# Patient Record
Sex: Male | Born: 1960
Health system: Southern US, Community
[De-identification: ages and names within clinical notes are randomized; demographics above are authoritative.]

## PROBLEM LIST (undated history)

## (undated) DIAGNOSIS — N183 Chronic kidney disease, stage 3 unspecified: Secondary | ICD-10-CM

## (undated) DIAGNOSIS — I10 Essential (primary) hypertension: Secondary | ICD-10-CM

---

## 2012-11-07 ENCOUNTER — Emergency Department (HOSPITAL_COMMUNITY)
Admission: EM | Admit: 2012-11-07 | Discharge: 2012-11-07 | Disposition: A | Discharge status: Home / Self Care | Payer: Self-pay | Attending: Emergency Medicine | Admitting: Emergency Medicine

## 2012-11-07 ENCOUNTER — Encounter (HOSPITAL_COMMUNITY): Payer: Self-pay | Admitting: *Deleted

## 2012-11-07 DIAGNOSIS — N342 Other urethritis: Secondary | ICD-10-CM | POA: Insufficient documentation

## 2012-11-07 DIAGNOSIS — F17200 Nicotine dependence, unspecified, uncomplicated: Secondary | ICD-10-CM | POA: Insufficient documentation

## 2012-11-07 DIAGNOSIS — I10 Essential (primary) hypertension: Secondary | ICD-10-CM | POA: Insufficient documentation

## 2012-11-07 DIAGNOSIS — R300 Dysuria: Secondary | ICD-10-CM | POA: Insufficient documentation

## 2012-11-07 LAB — URINE MICROSCOPIC-ADD ON

## 2012-11-07 LAB — URINALYSIS, ROUTINE W REFLEX MICROSCOPIC
Glucose, UA: NEGATIVE mg/dL
Protein, ur: NEGATIVE mg/dL
Specific Gravity, Urine: 1.015 (ref 1.005–1.030)

## 2012-11-07 MED ORDER — CEFTRIAXONE SODIUM 250 MG IJ SOLR
250.0000 mg | Freq: Once | INTRAMUSCULAR | Status: AC
  Administered 2012-11-07: 250 mg via INTRAMUSCULAR
  Filled 2012-11-07: qty 250

## 2012-11-07 MED ORDER — HYDROCHLOROTHIAZIDE 25 MG PO TABS: 12.5000 mg | ORAL_TABLET | Freq: Every day | ORAL | Status: DC

## 2012-11-07 MED ORDER — LISINOPRIL 10 MG PO TABS: 10.0000 mg | ORAL_TABLET | Freq: Every day | ORAL | Status: DC

## 2012-11-07 MED ORDER — METRONIDAZOLE 500 MG PO TABS
2000.0000 mg | ORAL_TABLET | Freq: Once | ORAL | Status: AC
  Administered 2012-11-07: 2000 mg via ORAL
  Filled 2012-11-07: qty 4

## 2012-11-07 MED ORDER — AZITHROMYCIN 250 MG PO TABS
1000.0000 mg | ORAL_TABLET | Freq: Once | ORAL | Status: AC
  Administered 2012-11-07: 1000 mg via ORAL
  Filled 2012-11-07: qty 4

## 2012-11-07 NOTE — ED Notes (Signed)
Pt c/o yellowish discharge from his penis since Friday. No odor

## 2012-11-07 NOTE — ED Notes (Signed)
Pt noticed with penile discharge since Friday, pain with urination per pt, denies any sores or swelling

## 2012-11-07 NOTE — ED Provider Notes (Signed)
History    This chart was scribed for Jason Ochoa, *, by Frederik Pear, ED scribe. The patient was seen in room APA04/APA04 and the patient's care was started at 2201.    CSN: [id number]  Arrival date & time 11/07/12  2139   First MD Initiated Contact with Patient 11/07/12 2201      Chief Complaint  Patient presents with  . Penile Discharge    (Consider location/radiation/quality/duration/timing/severity/associated sxs/prior treatment) The history is provided by the patient and medical records. No language interpreter was used.    Jason Ochoa is a 52 y.o. male who presents to the Emergency Department complaining of  sudden onset, intermittent yellow penile discharge with associated dysuria that began 3 days ago. He reports that he has recently had unprotected intercourse with new partners. He denies groin pain, headache, fever, nausea, emesis, diarrhea, or testicular swelling. In ED, his BP is 218/129, but denies a h/o of hypertension and reports that he has not been to a healthcare provider in years.  No PCP.  History reviewed. No pertinent past medical history.  History reviewed. No pertinent past surgical history.  History reviewed. No pertinent family history.  History  Substance Use Topics  . Smoking status: Current Some Day Smoker  . Smokeless tobacco: Not on file  . Alcohol Use: Yes      Review of Systems  Constitutional: Negative for fever.  HENT: Negative for congestion, sore throat and rhinorrhea.   Respiratory: Negative for cough and shortness of breath.   Cardiovascular: Negative for chest pain.  Gastrointestinal: Negative for nausea, vomiting, abdominal pain and diarrhea.  Genitourinary: Positive for discharge.  Musculoskeletal: Negative for back pain.  Neurological: Negative for headaches.    Allergies  Review of patient's allergies indicates no known allergies.  Home Medications  No current outpatient prescriptions on  file.  BP 212/112  Pulse 97  Temp(Src) 97.5 F (36.4 C) (Oral)  Resp 18  Ht 6' (1.829 m)  Wt 235 lb (106.595 kg)  BMI 31.86 kg/m2  SpO2 100%  Physical Exam  Nursing note and vitals reviewed. Constitutional: He is oriented to person, place, and time. He appears well-developed and well-nourished. No distress.  HENT:  Head: Normocephalic and atraumatic.  Right Ear: Hearing normal.  Nose: Nose normal.  Mouth/Throat: Oropharynx is clear and moist and mucous membranes are normal.  Eyes: Conjunctivae and EOM are normal. Pupils are equal, round, and reactive to light.  Neck: Normal range of motion. Neck supple.  Cardiovascular: Normal rate, regular rhythm, S1 normal and S2 normal.  Exam reveals no gallop and no friction rub.   No murmur heard. Hypertensive.  Pulmonary/Chest: Effort normal and breath sounds normal. No respiratory distress. He exhibits no tenderness.  Abdominal: Soft. Normal appearance and bowel sounds are normal. There is no hepatosplenomegaly. There is no tenderness. There is no rebound, no guarding, no tenderness at McBurney's point and negative Murphy's sign. No hernia.  Genitourinary: Testes normal. Uncircumcised.  Yellow urethral discharge. No lesions.   Musculoskeletal: Normal range of motion.  Neurological: He is alert and oriented to person, place, and time. He has normal strength. No cranial nerve deficit or sensory deficit. Coordination normal. GCS eye subscore is 4. GCS verbal subscore is 5. GCS motor subscore is 6.  Skin: Skin is warm, dry and intact. No rash noted. No cyanosis.  Psychiatric: He has a normal mood and affect. His speech is normal and behavior is normal. Thought content normal.    ED Course  Procedures (including critical care time)  DIAGNOSTIC STUDIES: Oxygen Saturation is 100% on room air, normal by my interpretation.    COORDINATION OF CARE:  22:10- Discussed planned course of treatment with the patient, including a UA, urine culture,  Rocephin, Zithromax, and Flagyl, who is agreeable at this time.  22:15-  Medication Orders- ceftriaxone (rocephin) injection 250 mg- once, azithromycin (zithromax) tablet 1,000 mg once, metronidazole (Flagyl) tablet 2,000 mg- once.  Labs Reviewed  URINALYSIS, ROUTINE W REFLEX MICROSCOPIC   No results found.   Diagnoses: 1. Urethritis 2. Hypertension    MDM  Patient presents to the ER for evaluation of yellow penile discharge for 2 days. Patient admits to unprotected sex. He says he had similar symptoms years ago when he had gonorrhea. Patient treated empirically.  Patient also has hypertension. He reports that he does have a history of high blood pressure, but has never been treated. He has occasional slight headache, but has not had any chest pain, shortness of breath. Blood pressure elevated at 212/112, asymptomatic currently. Initiated on lisinopril hydrochlorothiazide. Schedule followup with health Department for recheck.  I personally performed the services described in this documentation, which was scribed in my presence. The recorded information has been reviewed and is accurate.       Jason Crease, MD 11/07/12 2222

## 2012-11-09 LAB — URINE CULTURE: Culture: NO GROWTH

## 2016-02-10 ENCOUNTER — Emergency Department (HOSPITAL_COMMUNITY)
Admission: EM | Admit: 2016-02-10 | Discharge: 2016-02-10 | Disposition: A | Discharge status: Home / Self Care | Payer: Self-pay | Attending: Emergency Medicine | Admitting: Emergency Medicine

## 2016-02-10 ENCOUNTER — Encounter (HOSPITAL_COMMUNITY): Payer: Self-pay | Admitting: Emergency Medicine

## 2016-02-10 DIAGNOSIS — R300 Dysuria: Secondary | ICD-10-CM | POA: Insufficient documentation

## 2016-02-10 DIAGNOSIS — A64 Unspecified sexually transmitted disease: Secondary | ICD-10-CM | POA: Insufficient documentation

## 2016-02-10 DIAGNOSIS — I10 Essential (primary) hypertension: Secondary | ICD-10-CM | POA: Insufficient documentation

## 2016-02-10 DIAGNOSIS — F17200 Nicotine dependence, unspecified, uncomplicated: Secondary | ICD-10-CM | POA: Insufficient documentation

## 2016-02-10 DIAGNOSIS — N289 Disorder of kidney and ureter, unspecified: Secondary | ICD-10-CM | POA: Insufficient documentation

## 2016-02-10 DIAGNOSIS — Z79899 Other long term (current) drug therapy: Secondary | ICD-10-CM | POA: Insufficient documentation

## 2016-02-10 HISTORY — DX: Essential (primary) hypertension: I10

## 2016-02-10 LAB — CBC
HEMATOCRIT: 40.6 % (ref 39.0–52.0)
HEMOGLOBIN: 13.1 g/dL (ref 13.0–17.0)
MCH: 27.1 pg (ref 26.0–34.0)
MCHC: 32.3 g/dL (ref 30.0–36.0)
MCV: 84.1 fL (ref 78.0–100.0)
PLATELETS: 314 10*3/uL (ref 150–400)
RBC: 4.83 MIL/uL (ref 4.22–5.81)
RDW: 13.2 % (ref 11.5–15.5)
WBC: 8.2 10*3/uL (ref 4.0–10.5)

## 2016-02-10 LAB — BASIC METABOLIC PANEL
ANION GAP: 7 (ref 5–15)
BUN: 21 mg/dL — ABNORMAL HIGH (ref 6–20)
CHLORIDE: 104 mmol/L (ref 101–111)
CO2: 25 mmol/L (ref 22–32)
CREATININE: 1.62 mg/dL — AB (ref 0.61–1.24)
Calcium: 9 mg/dL (ref 8.9–10.3)
GFR calc non Af Amer: 47 mL/min — ABNORMAL LOW (ref >60)
GFR, EST AFRICAN AMERICAN: 54 mL/min — AB (ref >60)
Glucose, Bld: 114 mg/dL — ABNORMAL HIGH (ref 65–99)
POTASSIUM: 4.2 mmol/L (ref 3.5–5.1)
SODIUM: 136 mmol/L (ref 135–145)

## 2016-02-10 MED ORDER — LIDOCAINE HCL (PF) 1 % IJ SOLN
INTRAMUSCULAR | Status: AC
  Administered 2016-02-10: 1.1 mL
  Filled 2016-02-10: qty 5

## 2016-02-10 MED ORDER — LISINOPRIL 10 MG PO TABS
20.0000 mg | ORAL_TABLET | ORAL | Status: AC
  Administered 2016-02-10: 20 mg via ORAL
  Filled 2016-02-10: qty 2

## 2016-02-10 MED ORDER — HYDROCHLOROTHIAZIDE 25 MG PO TABS: 25.0000 mg | ORAL_TABLET | Freq: Every day | ORAL | 1 refills | Status: DC

## 2016-02-10 MED ORDER — METOPROLOL TARTRATE 50 MG PO TABS
50.0000 mg | ORAL_TABLET | Freq: Once | ORAL | Status: AC
  Administered 2016-02-10: 50 mg via ORAL
  Filled 2016-02-10: qty 1

## 2016-02-10 MED ORDER — CEFTRIAXONE SODIUM 250 MG IJ SOLR
250.0000 mg | Freq: Once | INTRAMUSCULAR | Status: AC
  Administered 2016-02-10: 250 mg via INTRAMUSCULAR
  Filled 2016-02-10: qty 250

## 2016-02-10 MED ORDER — CLONIDINE HCL 0.1 MG PO TABS
0.1000 mg | ORAL_TABLET | ORAL | Status: AC
  Administered 2016-02-10: 0.1 mg via ORAL
  Filled 2016-02-10: qty 1

## 2016-02-10 MED ORDER — LISINOPRIL 10 MG PO TABS: 10.0000 mg | ORAL_TABLET | Freq: Every day | ORAL | 1 refills | Status: DC

## 2016-02-10 MED ORDER — AZITHROMYCIN 250 MG PO TABS
1000.0000 mg | ORAL_TABLET | Freq: Once | ORAL | Status: AC
  Administered 2016-02-10: 1000 mg via ORAL
  Filled 2016-02-10: qty 4

## 2016-02-10 NOTE — Discharge Instructions (Signed)
[facility]  [facility] Doctor List    Kari Baars MD. Specialty: Pulmonary Disease Contact information: [address]  [address]  [place] [address]  [phone]   Syliva Overman, MD. Specialty: [facility] Medicine Contact information: [address]  [place] [address]  [phone]   Lilyan Punt, MD. Specialty: [facility] Medicine Contact information: [address]  [address]  [place] [address]  [phone]   Avon Gully, MD Specialty: Internal Medicine Contact information: [address] [place] [address]  [phone]   Catalina Pizza, MD. Specialty: Internal Medicine Contact information: [address]  [place] [address]  [phone]   Butch Penny, MD. Specialty: Family Medicine Contact information: [address]  [place] [address]  [phone]   John Giovanni, MD. Specialty: Family Medicine Contact information: [address]  [address]  [place] [address]  [phone]   Carylon Perches, MD. Specialty: Internal Medicine Contact information: [address]  [address]  [place] [address]  [phone]  Please obtain all of your results from medical records or have your doctors office obtain the results - share them with your doctor - you should be seen at your doctors office in the next 2 days. Call today to arrange your follow up. Take the medications as prescribed. Please review all of the medicines and only take them if you do not have an allergy to them. Please be aware that if you are taking birth control pills, taking other prescriptions, ESPECIALLY ANTIBIOTICS may make the birth control ineffective - if this is the case, either do not engage in sexual activity or use alternative methods of birth control such as condoms until you have finished the medicine and your family doctor says it is OK to restart them. If you are on a blood thinner such as COUMADIN, be aware that any other  medicine that you take may cause the coumadin to either work too much, or not enough - you should have your coumadin level rechecked in next 7 days if this is the case.  ?  It is also a possibility that you have an allergic reaction to any of the medicines that you have been prescribed - Everybody reacts differently to medications and while MOST people have no trouble with most medicines, you may have a reaction such as nausea, vomiting, rash, swelling, shortness of breath. If this is the case, please stop taking the medicine immediately and contact your physician.  ?  You should return to the ER if you develop severe or worsening symptoms.

## 2016-02-10 NOTE — ED Triage Notes (Signed)
Pt states he had a new sexual partner last week and now has yellow d/c.

## 2016-02-10 NOTE — ED Provider Notes (Signed)
[facility]-EMERGENCY DEPT Provider Note   CSN: [id number] Arrival date & time: 02/10/16  7253  First Provider Contact:  First MD Initiated Contact with Patient 02/10/16 0847     By signing my name below, I, Tanda Rockers, attest that this documentation has been prepared under the direction and in the presence of Eber Hong, MD. Electronically Signed: Tanda Rockers, ED Scribe. 02/10/16. 8:56 AM.   History   Chief Complaint Chief Complaint  Patient presents with  . SEXUALLY TRANSMITTED DISEASE    HPI Karnell Vanderloop is a 55 y.o. male who presents to the Emergency Department complaining of yellow penile discharge that began yesterday. Pt also complains of dysuria. He does admit to having unprotected intercourse with a new partner recently. He is unsure if he is at risk for HIV. Pt is found to be hypertensive in the ED with a BP of 218/159. Pt is not complaining of feeling hypertensive at this time and states that it normally runs high because he stopped taking anti-hypertensive medications more than 1 year ago due to lack of insurance. Denies chest pain, headache, visual changes, shortness of breath, leg swelling, abdominal pain, or any other associated symptoms.   The history is provided by the patient. No language interpreter was used.    Past Medical History:  Diagnosis Date  . Hypertension    not currently taking meds    There are no active problems to display for this patient.   History reviewed. No pertinent surgical history.     Home Medications    Prior to Admission medications   Medication Sig Start Date End Date Taking? Authorizing Provider  hydrochlorothiazide (HYDRODIURIL) 25 MG tablet Take 1 tablet (25 mg total) by mouth daily. 02/10/16   Eber Hong, MD  lisinopril (PRINIVIL,ZESTRIL) 10 MG tablet Take 1 tablet (10 mg total) by mouth daily. 02/10/16   Eber Hong, MD    Family History History reviewed. No pertinent family history.  Social History Social History    Substance Use Topics  . Smoking status: Current Some Day Smoker  . Smokeless tobacco: Never Used  . Alcohol use Yes     Allergies   Review of patient's allergies indicates no known allergies.   Review of Systems Review of Systems  Eyes: Negative for visual disturbance.  Respiratory: Negative for shortness of breath.   Cardiovascular: Negative for chest pain.  Gastrointestinal: Negative for abdominal pain.  Genitourinary: Positive for discharge and dysuria.  Neurological: Negative for headaches.   Physical Exam Updated Vital Signs BP 136/97   Pulse 66   Temp 97.8 F (36.6 C) (Oral)   Resp 14   Ht  (1.702 m)   Wt 230 lb (104.3 kg)   SpO2 100%   BMI 36.02 kg/m   Physical Exam  Constitutional: He appears well-developed and well-nourished. No distress.  HENT:  Head: Normocephalic and atraumatic.  Mouth/Throat: Oropharynx is clear and moist. No oropharyngeal exudate.  Eyes: Conjunctivae and EOM are normal. Pupils are equal, round, and reactive to light. Right eye exhibits no discharge. Left eye exhibits no discharge. No scleral icterus.  Neck: Normal range of motion. Neck supple. No JVD present. No thyromegaly present.  Cardiovascular: Normal rate, regular rhythm, normal heart sounds and intact distal pulses.  Exam reveals no gallop and no friction rub.   No murmur heard. Pulmonary/Chest: Effort normal and breath sounds normal. No respiratory distress. He has no wheezes. He has no rales.  Genitourinary:  Genitourinary Comments: Chaperone present. Uncircumcised. Milky yellow discharge  from head of penis.   Musculoskeletal: Normal range of motion. He exhibits no edema or tenderness.  Lymphadenopathy:    He has no cervical adenopathy.  Neurological: He is alert. Coordination normal.  Skin: Skin is warm and dry. No rash noted. He is not diaphoretic. No erythema.  Psychiatric: He has a normal mood and affect. His behavior is normal.  Nursing note and vitals  reviewed.    ED Treatments / Results    DIAGNOSTIC STUDIES: Oxygen Saturation is 100% on RA, normal by my interpretation.    COORDINATION OF CARE: 8:52 AM-Discussed treatment plan which includes GC Chlamydia, RPR, and HIV with pt at bedside and pt agreed to plan.   Labs (all labs ordered are listed, but only abnormal results are displayed) Labs Reviewed  BASIC METABOLIC PANEL - Abnormal; Notable for the following:       Result Value   Glucose, Bld 114 (*)    BUN 21 (*)    Creatinine, Ser 1.62 (*)    GFR calc non Af Amer 47 (*)    GFR calc Af Amer 54 (*)    All other components within normal limits  CBC  RPR  HIV ANTIBODY (ROUTINE TESTING)  GC/CHLAMYDIA PROBE AMP ([place]) NOT AT [facility]    EKG  EKG Interpretation None       Radiology No results found.  Procedures Procedures (including critical care time)  Medications Ordered in ED Medications  lisinopril (PRINIVIL,ZESTRIL) tablet 20 mg (20 mg Oral Given 02/10/16 0911)  cloNIDine (CATAPRES) tablet 0.1 mg (0.1 mg Oral Given 02/10/16 0911)  cefTRIAXone (ROCEPHIN) injection 250 mg (250 mg Intramuscular Given 02/10/16 0911)  azithromycin (ZITHROMAX) tablet 1,000 mg (1,000 mg Oral Given 02/10/16 0911)  lidocaine (PF) (XYLOCAINE) 1 % injection (1.1 mLs  Given 02/10/16 0912)  metoprolol (LOPRESSOR) tablet 50 mg (50 mg Oral Given 02/10/16 1045)     Initial Impression / Assessment and Plan / ED Course  I have reviewed the triage vital signs and the nursing notes.  Pertinent labs & imaging results that were available during my care of the patient were reviewed by me and considered in my medical decision making (see chart for details).  Clinical Course  Comment By Time  Creatinine is 1.62, will avoid ACEi prescription given this number - will d/c with BB and close f/u - WBC normal.  Has been given meds for STD's as well as counseling on informing partners.  He states he has not been using protection the last few times he  has had sex and has had a new partner. Eber Hong, MD 07/30 1010    The blood pressure improved with medications, the patient was given all of his follow-up information including a follow-up list for family doctors. He was instructed to tell all of his sexual partners about his sexually transmitted disease and encouraged to use a condom at all times   Final Clinical Impressions(s) / ED Diagnoses   Final diagnoses:  STD (male)  Essential hypertension  Renal insufficiency    New Prescriptions New Prescriptions   HYDROCHLOROTHIAZIDE (HYDRODIURIL) 25 MG TABLET    Take 1 tablet (25 mg total) by mouth daily.   LISINOPRIL (PRINIVIL,ZESTRIL) 10 MG TABLET    Take 1 tablet (10 mg total) by mouth daily.     Eber Hong, MD 02/10/16 1159

## 2016-02-11 LAB — RPR: RPR Ser Ql: NONREACTIVE

## 2016-02-11 LAB — GC/CHLAMYDIA PROBE AMP (~~LOC~~) NOT AT ARMC
CHLAMYDIA, DNA PROBE: NEGATIVE
Neisseria Gonorrhea: POSITIVE — AB

## 2016-02-11 LAB — HIV ANTIBODY (ROUTINE TESTING W REFLEX): HIV SCREEN 4TH GENERATION: NONREACTIVE

## 2016-02-12 ENCOUNTER — Telehealth (HOSPITAL_BASED_OUTPATIENT_CLINIC_OR_DEPARTMENT_OTHER): Payer: Self-pay | Admitting: *Deleted

## 2016-02-12 ENCOUNTER — Telehealth (HOSPITAL_BASED_OUTPATIENT_CLINIC_OR_DEPARTMENT_OTHER): Payer: Self-pay | Admitting: Emergency Medicine

## 2018-02-12 ENCOUNTER — Other Ambulatory Visit: Payer: Self-pay

## 2018-02-12 ENCOUNTER — Emergency Department (HOSPITAL_COMMUNITY): Payer: Self-pay

## 2018-02-12 ENCOUNTER — Encounter (HOSPITAL_COMMUNITY): Payer: Self-pay | Admitting: Emergency Medicine

## 2018-02-12 ENCOUNTER — Observation Stay (HOSPITAL_COMMUNITY)
Admission: EM | Admit: 2018-02-12 | Discharge: 2018-02-13 | Disposition: A | Discharge status: Home / Self Care | Payer: Self-pay | Attending: Internal Medicine | Admitting: Internal Medicine

## 2018-02-12 DIAGNOSIS — Z8249 Family history of ischemic heart disease and other diseases of the circulatory system: Secondary | ICD-10-CM | POA: Insufficient documentation

## 2018-02-12 DIAGNOSIS — I5041 Acute combined systolic (congestive) and diastolic (congestive) heart failure: Secondary | ICD-10-CM | POA: Diagnosis present

## 2018-02-12 DIAGNOSIS — Z87891 Personal history of nicotine dependence: Secondary | ICD-10-CM | POA: Insufficient documentation

## 2018-02-12 DIAGNOSIS — R0602 Shortness of breath: Secondary | ICD-10-CM

## 2018-02-12 DIAGNOSIS — I081 Rheumatic disorders of both mitral and tricuspid valves: Secondary | ICD-10-CM | POA: Insufficient documentation

## 2018-02-12 DIAGNOSIS — I169 Hypertensive crisis, unspecified: Secondary | ICD-10-CM

## 2018-02-12 DIAGNOSIS — R778 Other specified abnormalities of plasma proteins: Secondary | ICD-10-CM | POA: Diagnosis present

## 2018-02-12 DIAGNOSIS — I130 Hypertensive heart and chronic kidney disease with heart failure and stage 1 through stage 4 chronic kidney disease, or unspecified chronic kidney disease: Secondary | ICD-10-CM | POA: Insufficient documentation

## 2018-02-12 DIAGNOSIS — N189 Chronic kidney disease, unspecified: Secondary | ICD-10-CM

## 2018-02-12 DIAGNOSIS — N183 Chronic kidney disease, stage 3 unspecified: Secondary | ICD-10-CM | POA: Diagnosis present

## 2018-02-12 DIAGNOSIS — I161 Hypertensive emergency: Principal | ICD-10-CM | POA: Diagnosis present

## 2018-02-12 DIAGNOSIS — I509 Heart failure, unspecified: Secondary | ICD-10-CM

## 2018-02-12 DIAGNOSIS — R7989 Other specified abnormal findings of blood chemistry: Secondary | ICD-10-CM

## 2018-02-12 DIAGNOSIS — Z79899 Other long term (current) drug therapy: Secondary | ICD-10-CM | POA: Insufficient documentation

## 2018-02-12 HISTORY — DX: Chronic kidney disease, stage 3 (moderate): N18.3

## 2018-02-12 HISTORY — DX: Chronic kidney disease, stage 3 unspecified: N18.30

## 2018-02-12 LAB — CBC
HCT: 39.2 % (ref 39.0–52.0)
Hemoglobin: 13.1 g/dL (ref 13.0–17.0)
MCH: 28.1 pg (ref 26.0–34.0)
MCHC: 33.4 g/dL (ref 30.0–36.0)
MCV: 83.9 fL (ref 78.0–100.0)
PLATELETS: 317 10*3/uL (ref 150–400)
RBC: 4.67 MIL/uL (ref 4.22–5.81)
RDW: 13.5 % (ref 11.5–15.5)
WBC: 8 10*3/uL (ref 4.0–10.5)

## 2018-02-12 LAB — DIFFERENTIAL
BASOS ABS: 0 10*3/uL (ref 0.0–0.1)
BASOS PCT: 1 %
Eosinophils Absolute: 0.5 10*3/uL (ref 0.0–0.7)
Eosinophils Relative: 6 %
Lymphocytes Relative: 23 %
Lymphs Abs: 1.8 10*3/uL (ref 0.7–4.0)
MONOS PCT: 7 %
Monocytes Absolute: 0.6 10*3/uL (ref 0.1–1.0)
NEUTROS PCT: 63 %
Neutro Abs: 5.1 10*3/uL (ref 1.7–7.7)

## 2018-02-12 LAB — COMPREHENSIVE METABOLIC PANEL
ALBUMIN: 4.3 g/dL (ref 3.5–5.0)
ALT: 15 U/L (ref 0–44)
AST: 18 U/L (ref 15–41)
Alkaline Phosphatase: 66 U/L (ref 38–126)
Anion gap: 10 (ref 5–15)
BUN: 27 mg/dL — AB (ref 6–20)
CO2: 25 mmol/L (ref 22–32)
Calcium: 9.7 mg/dL (ref 8.9–10.3)
Chloride: 104 mmol/L (ref 98–111)
Creatinine, Ser: 2.22 mg/dL — ABNORMAL HIGH (ref 0.61–1.24)
GFR calc Af Amer: 36 mL/min — ABNORMAL LOW (ref >60)
GFR, EST NON AFRICAN AMERICAN: 31 mL/min — AB (ref >60)
Glucose, Bld: 101 mg/dL — ABNORMAL HIGH (ref 70–99)
POTASSIUM: 3.6 mmol/L (ref 3.5–5.1)
Sodium: 139 mmol/L (ref 135–145)
Total Bilirubin: 1 mg/dL (ref 0.3–1.2)
Total Protein: 8.7 g/dL — ABNORMAL HIGH (ref 6.5–8.1)

## 2018-02-12 LAB — BRAIN NATRIURETIC PEPTIDE: B Natriuretic Peptide: 662 pg/mL — ABNORMAL HIGH (ref 0.0–100.0)

## 2018-02-12 LAB — TROPONIN I: TROPONIN I: 0.13 ng/mL — AB (ref <0.03)

## 2018-02-12 MED ORDER — LABETALOL HCL 5 MG/ML IV SOLN
20.0000 mg | INTRAVENOUS | Status: DC | PRN
  Administered 2018-02-12 – 2018-02-13 (×8): 20 mg via INTRAVENOUS
  Filled 2018-02-12 (×8): qty 4

## 2018-02-12 NOTE — ED Provider Notes (Signed)
[facility] EMERGENCY DEPARTMENT Provider Note   CSN: [id number] Arrival date & time: 02/12/18  2220     History   Chief Complaint Chief Complaint  Patient presents with  . Shortness of Breath    HPI Jason Ochoa is a 57 y.o. male.  The history is provided by the patient.  He has history of hypertension and comes in with shortness of breath for approximately the last 5-6 weeks.  Dyspnea is exertional, but he is somewhat vague about how much exertion it takes to bring it on.  He states it has been stable over that time.  He denies paroxysmal nocturnal dyspnea or orthopnea.  He denies chest pain, heaviness, tightness, pressure.  He denies nausea, vomiting, diaphoresis.  He is not taking any medication for his blood pressure, and has not had a checked in a long time.  He tried to get an appointment with a physician, but was not able to get an appointment for several weeks and was advised to come to the emergency department.  Of note, he denies headaches, epistaxis, tinnitus.  Past Medical History:  Diagnosis Date  . Hypertension    not currently taking meds    There are no active problems to display for this patient.   History reviewed. No pertinent surgical history.      Home Medications    Prior to Admission medications   Medication Sig Start Date End Date Taking? Authorizing Provider  hydrochlorothiazide (HYDRODIURIL) 25 MG tablet Take 1 tablet (25 mg total) by mouth daily. 02/10/16   Eber Hong, MD  lisinopril (PRINIVIL,ZESTRIL) 10 MG tablet Take 1 tablet (10 mg total) by mouth daily. 02/10/16   Eber Hong, MD    Family History No family history on file.  Social History Social History   Tobacco Use  . Smoking status: Former Games developer  . Smokeless tobacco: Never Used  Substance Use Topics  . Alcohol use: Yes  . Drug use: Yes    Types: Marijuana    Comment: every night     Allergies   Patient has no known allergies.   Review of Systems Review of  Systems  All other systems reviewed and are negative.    Physical Exam Updated Vital Signs BP (!) 243/154   Pulse 94   Temp 98.1 F (36.7 C) (Oral)   Resp 18   Ht 5\' 10"  (1.778 m)   SpO2 97%   BMI 33.00 kg/m   Physical Exam  Nursing note and vitals reviewed.  57 year old male, resting comfortably and in no acute distress. Vital signs are significant for severe hypertension. Oxygen saturation is 97%, which is normal. Head is normocephalic and atraumatic. PERRLA, EOMI. Oropharynx is clear.  Fundi show AV crossing changes, but no hemorrhages or exudates or papilledema. Neck is nontender and supple without adenopathy or JVD. Back is nontender and there is no CVA tenderness. Lungs are clear without rales, wheezes, or rhonchi. Chest is nontender. Heart has regular rate and rhythm without murmur. Abdomen is soft, flat, nontender without masses or hepatosplenomegaly and peristalsis is normoactive. Extremities have trace edema, full range of motion is present. Skin is warm and dry without rash. Neurologic: Mental status is normal, cranial nerves are intact, there are no motor or sensory deficits.  ED Treatments / Results  Labs (all labs ordered are listed, but only abnormal results are displayed) Labs Reviewed  COMPREHENSIVE METABOLIC PANEL - Abnormal; Notable for the following components:      Result Value  Glucose, Bld 101 (*)    BUN 27 (*)    Creatinine, Ser 2.22 (*)    Total Protein 8.7 (*)    GFR calc non Af Amer 31 (*)    GFR calc Af Amer 36 (*)    All other components within normal limits  TROPONIN I - Abnormal; Notable for the following components:   Troponin I 0.13 (*)    All other components within normal limits  BRAIN NATRIURETIC PEPTIDE - Abnormal; Notable for the following components:   B Natriuretic Peptide 662.0 (*)    All other components within normal limits  CBC  DIFFERENTIAL  TROPONIN I    EKG EKG Interpretation  Date/Time:  Friday February 12 2018  23:39:46 EDT Ventricular Rate:  74 PR Interval:    QRS Duration: 95 QT Interval:  421 QTC Calculation: 468 R Axis:   44 Text Interpretation:  Sinus rhythm Probable left atrial enlargement Left ventricular hypertrophy Nonspecific T abnormalities, lateral leads - probably secondary to  Left ventricular hypertrophy No old tracing to compare Confirmed by Dione BoozeGlick, Damian Buckles [phone]([id number]) on 02/12/2018 11:43:38 PM   Radiology Dg Chest 2 View  Result Date: 02/12/2018 CLINICAL DATA:  1-2 week history of congestion and shortness of breath. EXAM: CHEST - 2 VIEW COMPARISON:  None. FINDINGS: Cardiopericardial silhouette is markedly enlarged. There is pulmonary vascular congestion with interstitial pulmonary edema pattern. No pleural effusion. The visualized bony structures of the thorax are intact. Telemetry leads overlie the chest. IMPRESSION: Marked enlargement of the cardiopericardial silhouette. Likely related to cardiomegaly although pericardial effusion not excluded. Vascular congestion with interstitial edema. Electronically Signed   By: Kennith CenterEric  Mansell M.D.   On: 02/12/2018 23:20    Procedures Procedures  CRITICAL CARE Performed by: Dione Boozeavid Malachi Kinzler Total critical care time: 85  minutes Critical care time was exclusive of separately billable procedures and treating other patients. Critical care was necessary to treat or prevent imminent or life-threatening deterioration. Critical care was time spent personally by me on the following activities: development of treatment plan with patient and/or surrogate as well as nursing, discussions with consultants, evaluation of patient's response to treatment, examination of patient, obtaining history from patient or surrogate, ordering and performing treatments and interventions, ordering and review of laboratory studies, ordering and review of radiographic studies, pulse oximetry and re-evaluation of patient's condition.  Medications Ordered in ED Medications  labetalol  (NORMODYNE,TRANDATE) injection 20 mg (20 mg Intravenous Given 02/13/18 0012)  furosemide (LASIX) injection 40 mg (has no administration in time range)     Initial Impression / Assessment and Plan / ED Course  I have reviewed the triage vital signs and the nursing notes.  Pertinent labs & imaging results that were available during my care of the patient were reviewed by me and considered in my medical decision making (see chart for details).  Exertional dyspnea with severe hypertension.  There is concern for hypertensive urgency/hypertensive crisis.  Will check ECG, chest x-ray, screening labs.  Old records are reviewed, and he did have blood pressure 212/112 at an ED visit in 2014.  2017, he had an ED visit where blood pressure was 218/159, treated in the emergency department with metoprolol, lisinopril, clonidine, and he was discharged with prescriptions for hydrochlorothiazide and lisinopril.  Patient states that he took them for less than a month.  Creatinine at that visit was 1.62.  Chest x-ray shows cardiomegaly and pulmonary vascular congestion.  He is being started on intravenous labetalol for blood pressure control.  Creatinine has  come back to 2.2.  Troponin is mildly elevated 0.13.  This is felt to represent demand ischemia and not ACS, but will need to trend this -repeat troponin has been ordered.  BNP is also elevated consistent with onset heart failure.  He is given a dose of furosemide.  Case is discussed with Dr. Robb Matar, of [facility] hospital[facility], who agrees to admit the patient.  Final Clinical Impressions(s) / ED Diagnoses   Final diagnoses:  SOB (shortness of breath)  Hypertensive crisis  New onset of congestive heart failure (HCC)  Chronic kidney disease, unspecified CKD stage  Elevated troponin I level    ED Discharge Orders    None       Dione Booze, MD 02/13/18 [id number]

## 2018-02-12 NOTE — ED Triage Notes (Signed)
Pt c/o SOB for about 5-6 weeks, states it is not worse tonight. Has not seen a PCP

## 2018-02-12 NOTE — ED Notes (Signed)
Critical Value:  Troponin 0.13  Date & Time Notied:  02/12/18 & 2344 hrs  Provider Notified: Dr. Preston FleetingGlick  Orders Received/Actions taken: N/A

## 2018-02-12 NOTE — ED Notes (Signed)
ED Provider at bedside. 

## 2018-02-13 ENCOUNTER — Other Ambulatory Visit: Payer: Self-pay

## 2018-02-13 ENCOUNTER — Encounter (HOSPITAL_COMMUNITY): Payer: Self-pay | Admitting: *Deleted

## 2018-02-13 ENCOUNTER — Observation Stay (HOSPITAL_COMMUNITY): Payer: Self-pay

## 2018-02-13 DIAGNOSIS — N183 Chronic kidney disease, stage 3 unspecified: Secondary | ICD-10-CM | POA: Diagnosis present

## 2018-02-13 DIAGNOSIS — I169 Hypertensive crisis, unspecified: Secondary | ICD-10-CM

## 2018-02-13 DIAGNOSIS — I161 Hypertensive emergency: Secondary | ICD-10-CM

## 2018-02-13 DIAGNOSIS — N189 Chronic kidney disease, unspecified: Secondary | ICD-10-CM

## 2018-02-13 DIAGNOSIS — R7989 Other specified abnormal findings of blood chemistry: Secondary | ICD-10-CM | POA: Diagnosis present

## 2018-02-13 DIAGNOSIS — I5041 Acute combined systolic (congestive) and diastolic (congestive) heart failure: Secondary | ICD-10-CM

## 2018-02-13 DIAGNOSIS — R778 Other specified abnormalities of plasma proteins: Secondary | ICD-10-CM | POA: Diagnosis present

## 2018-02-13 DIAGNOSIS — R748 Abnormal levels of other serum enzymes: Secondary | ICD-10-CM

## 2018-02-13 LAB — TROPONIN I
TROPONIN I: 0.09 ng/mL — AB (ref <0.03)
TROPONIN I: 0.12 ng/mL — AB (ref <0.03)
Troponin I: 0.07 ng/mL (ref <0.03)

## 2018-02-13 LAB — MRSA PCR SCREENING: MRSA by PCR: NEGATIVE

## 2018-02-13 LAB — ECHOCARDIOGRAM COMPLETE
HEIGHTINCHES: 70 in
Weight: 3418.01 oz

## 2018-02-13 LAB — MAGNESIUM: Magnesium: 2.1 mg/dL (ref 1.7–2.4)

## 2018-02-13 MED ORDER — METOPROLOL TARTRATE 25 MG PO TABS
25.0000 mg | ORAL_TABLET | Freq: Two times a day (BID) | ORAL | Status: DC
  Administered 2018-02-13: 25 mg via ORAL
  Filled 2018-02-13: qty 1

## 2018-02-13 MED ORDER — ONDANSETRON HCL 4 MG PO TABS: 4.0000 mg | ORAL_TABLET | Freq: Four times a day (QID) | ORAL | Status: DC | PRN

## 2018-02-13 MED ORDER — ACETAMINOPHEN 650 MG RE SUPP: 650.0000 mg | Freq: Four times a day (QID) | RECTAL | Status: DC | PRN

## 2018-02-13 MED ORDER — AMLODIPINE BESYLATE 5 MG PO TABS
ORAL_TABLET | ORAL | Status: AC
  Filled 2018-02-13: qty 1

## 2018-02-13 MED ORDER — ENOXAPARIN SODIUM 40 MG/0.4ML ~~LOC~~ SOLN
40.0000 mg | SUBCUTANEOUS | Status: DC
  Administered 2018-02-13: 40 mg via SUBCUTANEOUS

## 2018-02-13 MED ORDER — AMLODIPINE BESYLATE 5 MG PO TABS
5.0000 mg | ORAL_TABLET | Freq: Every day | ORAL | Status: DC
  Administered 2018-02-13: 5 mg via ORAL

## 2018-02-13 MED ORDER — METOPROLOL TARTRATE 25 MG PO TABS: 25.0000 mg | ORAL_TABLET | Freq: Two times a day (BID) | ORAL | 0 refills | Status: AC

## 2018-02-13 MED ORDER — ENOXAPARIN SODIUM 40 MG/0.4ML ~~LOC~~ SOLN
SUBCUTANEOUS | Status: AC
Start: 2018-02-13 — End: 2018-02-13
  Filled 2018-02-13: qty 0.4

## 2018-02-13 MED ORDER — FUROSEMIDE 40 MG PO TABS: 40.0000 mg | ORAL_TABLET | Freq: Every day | ORAL | 11 refills | Status: AC

## 2018-02-13 MED ORDER — ACETAMINOPHEN 325 MG PO TABS: 650.0000 mg | ORAL_TABLET | Freq: Four times a day (QID) | ORAL | Status: DC | PRN

## 2018-02-13 MED ORDER — HYDRALAZINE HCL 20 MG/ML IJ SOLN: 10.0000 mg | INTRAMUSCULAR | Status: DC | PRN

## 2018-02-13 MED ORDER — FUROSEMIDE 10 MG/ML IJ SOLN
40.0000 mg | Freq: Once | INTRAMUSCULAR | Status: AC
  Administered 2018-02-13: 40 mg via INTRAVENOUS
  Filled 2018-02-13: qty 4

## 2018-02-13 MED ORDER — AMLODIPINE BESYLATE 5 MG PO TABS: 5.0000 mg | ORAL_TABLET | Freq: Every day | ORAL | 0 refills | Status: AC

## 2018-02-13 MED ORDER — ONDANSETRON HCL 4 MG/2ML IJ SOLN: 4.0000 mg | Freq: Four times a day (QID) | INTRAMUSCULAR | Status: DC | PRN

## 2018-02-13 NOTE — Progress Notes (Signed)
*  PRELIMINARY RESULTS* Echocardiogram 2D Echocardiogram has been performed.  Stacey DrainWhite, Man Effertz J 02/13/2018, 10:42 AM

## 2018-02-13 NOTE — Progress Notes (Signed)
Pt discharged home. PIV removed; no complications. Instructions including medications and follow up appointments discussed with pt and wife; understanding verbalized. Pt left with all belongings. VSS.

## 2018-02-13 NOTE — Discharge Summary (Signed)
Physician Discharge Summary  Jason Ochoa FAO:[id number] DOB: [birth year] DOA: 02/12/2018  PCP: Patient, No Pcp Per  Admit date: 02/12/2018 Discharge date: 02/13/2018  Admitted From: home Disposition:  home  Recommendations for Outpatient Follow-up:  1. Follow up with PCP in 1-2 weeks 2. Please obtain BMP/CBC in one week 3. Patient has been referred to cardiology for follow up 4. He has also been referred to nephrology  Discharge Condition:stable CODE STATUS: full code Diet recommendation: heart healthy  Brief/Interim Summary: 57 y/o male with history of HTN, presented to the hospital with complaints of progressive dyspnea. He was found to have severe hypertension, mildly elevated troponin and chest xray consistent with vascular congestion. Echocardiogram indicated mildly depressed EF of 45-50% with grade 3 diastolic dysfunction. He did not have any significant wall motion abnormalities. He received one dose of lasix in the ED with improvement in his breathing. He was started on norvasc and metoprolol for blood pressure control, with improvement. Further titration of BB will be challenging since patient heart rate is in 50s. He does have CKD 3 and creatinine has trended up over the last few years. This will need to be followed as outpatient by neurology. His elevation of troponin has remained flat and is inconsistent with ACS. This is likely related to severe hypertension. Patient is feeling significantly improved now, no chest pain and shortness of breath is better. He has been referred to cardiology office for further follow up. He was able to ambulate without difficulty. He will discharge home today in stable condition  Discharge Diagnoses:  Principal Problem:   Hypertensive emergency Active Problems:   Elevated troponin   Stage 3 chronic kidney disease (HCC)   Acute combined systolic and diastolic CHF, NYHA class 2 (HCC)    Discharge Instructions   Allergies as of 02/13/2018   No Known  Allergies     Medication List    TAKE these medications   amLODipine 5 MG tablet Commonly known as:  NORVASC Take 1 tablet (5 mg total) by mouth daily. Start taking on:  02/14/2018   furosemide 40 MG tablet Commonly known as:  LASIX Take 1 tablet (40 mg total) by mouth daily.   metoprolol tartrate 25 MG tablet Commonly known as:  LOPRESSOR Take 1 tablet (25 mg total) by mouth 2 (two) times daily.       No Known Allergies  Consultations:     Procedures/Studies: Dg Chest 2 View  Result Date: 02/12/2018 CLINICAL DATA:  1-2 week history of congestion and shortness of breath. EXAM: CHEST - 2 VIEW COMPARISON:  None. FINDINGS: Cardiopericardial silhouette is markedly enlarged. There is pulmonary vascular congestion with interstitial pulmonary edema pattern. No pleural effusion. The visualized bony structures of the thorax are intact. Telemetry leads overlie the chest. IMPRESSION: Marked enlargement of the cardiopericardial silhouette. Likely related to cardiomegaly although pericardial effusion not excluded. Vascular congestion with interstitial edema. Electronically Signed   By: Kennith Center M.D.   On: 02/12/2018 23:20      Subjective: Feeling better, no chest pain, shortness of breath is better  Discharge Exam: Vitals:   02/13/18 0800 02/13/18 0900  BP: (!) 150/98 (!) 151/92  Pulse: (!) 57 (!) 48  Resp:    Temp:    SpO2: 99% 97%   Vitals:   02/13/18 0700 02/13/18 0724 02/13/18 0800 02/13/18 0900  BP: 140/82  (!) 150/98 (!) 151/92  Pulse: (!) 51 (!) 53 (!) 57 (!) 48  Resp:      Temp:  97.7 F (36.5 C)    TempSrc:  Oral    SpO2: 100% 99% 99% 97%  Weight:      Height:        General: Pt is alert, awake, not in acute distress Cardiovascular: RRR, S1/S2 +, no rubs, no gallops Respiratory: CTA bilaterally, no wheezing, no rhonchi Abdominal: Soft, NT, ND, bowel sounds + Extremities: no edema, no cyanosis    The results of significant diagnostics from this  hospitalization (including imaging, microbiology, ancillary and laboratory) are listed below for reference.     Microbiology: Recent Results (from the past 240 hour(s))  MRSA PCR Screening     Status: None   Collection Time: 02/13/18  2:55 AM  Result Value Ref Range Status   MRSA by PCR NEGATIVE NEGATIVE Final    Comment:        The GeneXpert MRSA Assay (FDA approved for NASAL specimens only), is one component of a comprehensive MRSA colonization surveillance program. It is not intended to diagnose MRSA infection nor to guide or monitor treatment for MRSA infections. Performed at [facility], [address] [place], [place] [id number]      Labs: BNP (last 3 results) Recent Labs    02/12/18 2256  BNP 662.0*   Basic Metabolic Panel: Recent Labs  Lab 02/12/18 2256 02/13/18 0454  NA 139  --   K 3.6  --   CL 104  --   CO2 25  --   GLUCOSE 101*  --   BUN 27*  --   CREATININE 2.22*  --   CALCIUM 9.7  --   MG  --  2.1   Liver Function Tests: Recent Labs  Lab 02/12/18 2256  AST 18  ALT 15  ALKPHOS 66  BILITOT 1.0  PROT 8.7*  ALBUMIN 4.3   No results for input(s): LIPASE, AMYLASE in the last 168 hours. No results for input(s): AMMONIA in the last 168 hours. CBC: Recent Labs  Lab 02/12/18 2256  WBC 8.0  NEUTROABS 5.1  HGB 13.1  HCT 39.2  MCV 83.9  PLT 317   Cardiac Enzymes: Recent Labs  Lab 02/12/18 2256 02/13/18 0006 02/13/18 0454 02/13/18 1057  TROPONINI 0.13* 0.12* 0.09* 0.07*   BNP: Invalid input(s): POCBNP CBG: No results for input(s): GLUCAP in the last 168 hours. D-Dimer No results for input(s): DDIMER in the last 72 hours. Hgb A1c No results for input(s): HGBA1C in the last 72 hours. Lipid Profile No results for input(s): CHOL, HDL, LDLCALC, TRIG, CHOLHDL, LDLDIRECT in the last 72 hours. Thyroid function studies No results for input(s): TSH, T4TOTAL, T3FREE, THYROIDAB in the last 72 hours.  Invalid input(s): FREET3 Anemia work  up No results for input(s): VITAMINB12, FOLATE, FERRITIN, TIBC, IRON, RETICCTPCT in the last 72 hours. Urinalysis    Component Value Date/Time   COLORURINE YELLOW 11/07/2012 2150   APPEARANCEUR HAZY (A) 11/07/2012 2150   LABSPEC 1.015 11/07/2012 2150   PHURINE 7.0 11/07/2012 2150   GLUCOSEU NEGATIVE 11/07/2012 2150   HGBUR SMALL (A) 11/07/2012 2150   BILIRUBINUR NEGATIVE 11/07/2012 2150   KETONESUR NEGATIVE 11/07/2012 2150   PROTEINUR NEGATIVE 11/07/2012 2150   UROBILINOGEN 1.0 11/07/2012 2150   NITRITE NEGATIVE 11/07/2012 2150   LEUKOCYTESUR MODERATE (A) 11/07/2012 2150   Sepsis Labs Invalid input(s): PROCALCITONIN,  WBC,  LACTICIDVEN Microbiology Recent Results (from the past 240 hour(s))  MRSA PCR Screening     Status: None   Collection Time: 02/13/18  2:55 AM  Result Value Ref  Range Status   MRSA by PCR NEGATIVE NEGATIVE Final    Comment:        The GeneXpert MRSA Assay (FDA approved for NASAL specimens only), is one component of a comprehensive MRSA colonization surveillance program. It is not intended to diagnose MRSA infection nor to guide or monitor treatment for MRSA infections. Performed at [facility], [address], [place], [place] [address]      Time coordinating discharge:  SIGNED:   Erick Blinks, MD  [facility] 02/13/2018, 2:00 PM Pager   If 7PM-7AM, please contact night-coverage [email or web address] Password [email or web address]

## 2018-02-13 NOTE — H&P (Signed)
History and Physical    Jason KluverBarry Ochoa OZH:[id number] DOB: 13-Jan-1961 DOA: 02/12/2018  PCP: Patient, No Pcp Per  Patient coming from: Home.  I have personally briefly reviewed patient[facility]  Chief Complaint: Shortness of breath.  HPI: Jason KluverBarry Najera is a 57 y.o. male with medical history significant of uncontrolled and untreated hypertension, stage III chronic kidney disease who is coming to the emergency department with complaints of progressively worse dyspnea for the past 5 to 6 weeks.  He denies chest pain, palpitations, dizziness, diaphoresis, PND, orthopnea or pitting edema of the lower extremities.  No fever, no chills, no hemoptysis, occasional productive cough.  Denies abdominal pain, nausea, emesis, diarrhea, constipation, melena or hematochezia.  Denies dysuria, frequency or hematuria.  No polyuria, polydipsia, polyphagia or blurred vision.  Denies heat or cold intolerance.  Denies skin rashes or pruritus.  ED Course: Initial vital signs temperature 98.1 F, pulse 94, respiration 18, blood pressure 243/154 mmHg and O2 sat 97% on room air.  Patient was given 40 mg of furosemide IVP and 7 doses of labetalol 20 mg IVP for control.  His CBC had a white count of 8.0, hemoglobin 13.1 and platelets 317.  CMP shows a glucose of 101, worsening BUN and creatinine, 1 compared to 2 years ago, now 27 and 2.22 mg/dL.  His total protein was elevated at 8.7 g/dL.  The rest of the CMP values were normal. Troponin was 0.13 and 0.12 ng/mL.  BNP 662.0 pg/mL. His chest radiograph showed marked enlargement of the cardiopericardial silhouette.  There was vascular congestion with interstitial edema.  Please see images and full radiology report for further detail.  Review of Systems: As per HPI otherwise 10 point review of systems negative.   Past Medical History:  Diagnosis Date  . Hypertension    not currently taking meds  . Stage 3 chronic kidney disease (HCC)      History reviewed. No pertinent surgical history.   reports that he has quit smoking. He has never used smokeless tobacco. He reports that he drinks alcohol. He reports that he has current or past drug history. Drug: Marijuana.  No Known Allergies  Family History  Problem Relation Age of Onset  . Kidney failure Mother   . Heart disease Mother   . Hypertension Mother   . Hypertension Father   . Hypertension Brother   . Hypertension Brother     Prior to Admission medications   Medication Sig Start Date End Date Taking? Authorizing Provider  hydrochlorothiazide (HYDRODIURIL) 25 MG tablet Take 1 tablet (25 mg total) by mouth daily. 02/10/16   Eber HongMiller, Brian, MD  lisinopril (PRINIVIL,ZESTRIL) 10 MG tablet Take 1 tablet (10 mg total) by mouth daily. 02/10/16   Eber HongMiller, Brian, MD    Physical Exam: Vitals:   02/13/18 0230 02/13/18 0301 02/13/18 0400 02/13/18 0500  BP: (!) 137/100 (!) 168/113 (!) 154/95 137/84  Pulse: (!) 56 (!) 57 60 (!) 58  Resp: 16     Temp:  98.1 F (36.7 C) 97.9 F (36.6 C)   TempSrc:  Oral Oral   SpO2: 90% 100% 99% 98%  Weight:  96.9 kg (213 lb 10 oz)    Height:  5\' 10"  (1.778 m)      Constitutional: NAD, calm, comfortable Eyes: PERRL, lids and conjunctivae normal ENMT: Mucous membranes are moist. Posterior pharynx clear of any exudate or lesions. Neck: normal, supple, no masses, no thyromegaly Respiratory: clear to auscultation bilaterally, no wheezing,  no crackles. Normal respiratory effort. No accessory muscle use.  Cardiovascular: Bradycardic at 58 bpm, no murmurs / rubs / gallops. No extremity edema. 2+ pedal pulses. No carotid bruits.  Abdomen: no tenderness, no masses palpated. No hepatosplenomegaly. Bowel sounds positive.  Musculoskeletal: no clubbing / cyanosis. Good ROM, no contractures. Normal muscle tone.  Skin: no rashes, lesions, ulcers on limited dermatological examination. Neurologic: CN 2-12 grossly intact. Sensation intact, DTR normal.  Strength 5/5 in all 4.  Psychiatric: Normal judgment and insight. Alert and oriented x 3. Normal mood.   Labs on Admission: I have personally reviewed following labs and imaging studies  CBC: Recent Labs  Lab 02/12/18 2256  WBC 8.0  NEUTROABS 5.1  HGB 13.1  HCT 39.2  MCV 83.9  PLT 317   Basic Metabolic Panel: Recent Labs  Lab 02/12/18 2256  NA 139  K 3.6  CL 104  CO2 25  GLUCOSE 101*  BUN 27*  CREATININE 2.22*  CALCIUM 9.7   GFR: Estimated Creatinine Clearance: 43.4 mL/min (A) (by C-G formula based on SCr of 2.22 mg/dL (H)). Liver Function Tests: Recent Labs  Lab 02/12/18 2256  AST 18  ALT 15  ALKPHOS 66  BILITOT 1.0  PROT 8.7*  ALBUMIN 4.3   No results for input(s): LIPASE, AMYLASE in the last 168 hours. No results for input(s): AMMONIA in the last 168 hours. Coagulation Profile: No results for input(s): INR, PROTIME in the last 168 hours. Cardiac Enzymes: Recent Labs  Lab 02/12/18 2256 02/13/18 0006  TROPONINI 0.13* 0.12*   BNP (last 3 results) No results for input(s): PROBNP in the last 8760 hours. HbA1C: No results for input(s): HGBA1C in the last 72 hours. CBG: No results for input(s): GLUCAP in the last 168 hours. Lipid Profile: No results for input(s): CHOL, HDL, LDLCALC, TRIG, CHOLHDL, LDLDIRECT in the last 72 hours. Thyroid Function Tests: No results for input(s): TSH, T4TOTAL, FREET4, T3FREE, THYROIDAB in the last 72 hours. Anemia Panel: No results for input(s): VITAMINB12, FOLATE, FERRITIN, TIBC, IRON, RETICCTPCT in the last 72 hours. Urine analysis:    Component Value Date/Time   COLORURINE YELLOW 11/07/2012 2150   APPEARANCEUR HAZY (A) 11/07/2012 2150   LABSPEC 1.015 11/07/2012 2150   PHURINE 7.0 11/07/2012 2150   GLUCOSEU NEGATIVE 11/07/2012 2150   HGBUR SMALL (A) 11/07/2012 2150   BILIRUBINUR NEGATIVE 11/07/2012 2150   KETONESUR NEGATIVE 11/07/2012 2150   PROTEINUR NEGATIVE 11/07/2012 2150   UROBILINOGEN 1.0 11/07/2012 2150     NITRITE NEGATIVE 11/07/2012 2150   LEUKOCYTESUR MODERATE (A) 11/07/2012 2150    Radiological Exams on Admission: Dg Chest 2 View  Result Date: 02/12/2018 CLINICAL DATA:  1-2 week history of congestion and shortness of breath. EXAM: CHEST - 2 VIEW COMPARISON:  None. FINDINGS: Cardiopericardial silhouette is markedly enlarged. There is pulmonary vascular congestion with interstitial pulmonary edema pattern. No pleural effusion. The visualized bony structures of the thorax are intact. Telemetry leads overlie the chest. IMPRESSION: Marked enlargement of the cardiopericardial silhouette. Likely related to cardiomegaly although pericardial effusion not excluded. Vascular congestion with interstitial edema. Electronically Signed   By: Kennith Center M.D.   On: 02/12/2018 23:20    EKG: Independently reviewed. Sinus rhythm Probable left atrial enlargement Left ventricular hypertrophy Nonspecific T abnormalities, lateral leads - probably secondary to Left ventricular hypertrophy No old tracing to compare  Assessment/Plan Principal Problem:   Hypertensive emergency Observation/stepdown. Supplemental oxygen as needed. Start amlodipine 5 mg p.o. daily (already seems to be working). Start metoprolol 25  mg p.o. twice daily. Monitor heart rate and blood pressure. The patient was advised to keep tight blood pressure control to avoid complications. The patient was advised and encouraged to establish with a doctor. He voiced understanding.  Active Problems:   Elevated troponin Likely due to heart strain due to severe hypertension. Trend troponin levels. Follow-up EKG in a.m. Check echocardiogram.    Stage 3 chronic kidney disease (HCC) The patient was advised to have high blood pressure control given his mother history of ESRD secondary to hypertensive nephropathy.  He voiced understanding.    DVT prophylaxis: Lovenox SQ. Code Status: Full code. Family Communication: His wife was present in  the room. Disposition Plan: For blood pressure control and further work-up. Consults called:  Admission status: Observation/stepdown.   Bobette Mo MD [facility] Pager [phone].  If 7PM-7AM, please contact night-coverage [email or web address] Password [facility]  02/13/2018, 5:44 AM

## 2018-02-15 ENCOUNTER — Emergency Department (HOSPITAL_COMMUNITY)
Admission: EM | Admit: 2018-02-15 | Discharge: 2018-02-16 | Disposition: A | Discharge status: Home / Self Care | Payer: Self-pay | Attending: Emergency Medicine | Admitting: Emergency Medicine

## 2018-02-15 ENCOUNTER — Other Ambulatory Visit: Payer: Self-pay

## 2018-02-15 ENCOUNTER — Encounter (HOSPITAL_COMMUNITY): Payer: Self-pay | Admitting: Emergency Medicine

## 2018-02-15 DIAGNOSIS — R7989 Other specified abnormal findings of blood chemistry: Secondary | ICD-10-CM

## 2018-02-15 DIAGNOSIS — I130 Hypertensive heart and chronic kidney disease with heart failure and stage 1 through stage 4 chronic kidney disease, or unspecified chronic kidney disease: Secondary | ICD-10-CM | POA: Insufficient documentation

## 2018-02-15 DIAGNOSIS — N289 Disorder of kidney and ureter, unspecified: Secondary | ICD-10-CM

## 2018-02-15 DIAGNOSIS — N183 Chronic kidney disease, stage 3 (moderate): Secondary | ICD-10-CM | POA: Insufficient documentation

## 2018-02-15 DIAGNOSIS — I5042 Chronic combined systolic (congestive) and diastolic (congestive) heart failure: Secondary | ICD-10-CM | POA: Insufficient documentation

## 2018-02-15 DIAGNOSIS — Z87891 Personal history of nicotine dependence: Secondary | ICD-10-CM | POA: Insufficient documentation

## 2018-02-15 DIAGNOSIS — R748 Abnormal levels of other serum enzymes: Secondary | ICD-10-CM | POA: Insufficient documentation

## 2018-02-15 DIAGNOSIS — R778 Other specified abnormalities of plasma proteins: Secondary | ICD-10-CM

## 2018-02-15 DIAGNOSIS — I10 Essential (primary) hypertension: Secondary | ICD-10-CM

## 2018-02-15 NOTE — ED Triage Notes (Signed)
Pt states he has been checking blood pressure at home since starting blood pressure meds Saturday.

## 2018-02-16 LAB — CBC WITH DIFFERENTIAL/PLATELET
BASOS ABS: 0 10*3/uL (ref 0.0–0.1)
BASOS PCT: 0 %
Eosinophils Absolute: 0.5 10*3/uL (ref 0.0–0.7)
Eosinophils Relative: 6 %
HCT: 38.6 % — ABNORMAL LOW (ref 39.0–52.0)
Hemoglobin: 12.4 g/dL — ABNORMAL LOW (ref 13.0–17.0)
LYMPHS PCT: 24 %
Lymphs Abs: 1.9 10*3/uL (ref 0.7–4.0)
MCH: 27.6 pg (ref 26.0–34.0)
MCHC: 32.1 g/dL (ref 30.0–36.0)
MCV: 85.8 fL (ref 78.0–100.0)
Monocytes Absolute: 0.6 10*3/uL (ref 0.1–1.0)
Monocytes Relative: 7 %
NEUTROS ABS: 5.1 10*3/uL (ref 1.7–7.7)
Neutrophils Relative %: 63 %
PLATELETS: 322 10*3/uL (ref 150–400)
RBC: 4.5 MIL/uL (ref 4.22–5.81)
RDW: 13.8 % (ref 11.5–15.5)
WBC: 8.1 10*3/uL (ref 4.0–10.5)

## 2018-02-16 LAB — BASIC METABOLIC PANEL
Anion gap: 8 (ref 5–15)
BUN: 27 mg/dL — AB (ref 6–20)
CHLORIDE: 109 mmol/L (ref 98–111)
CO2: 23 mmol/L (ref 22–32)
Calcium: 9.3 mg/dL (ref 8.9–10.3)
Creatinine, Ser: 2.02 mg/dL — ABNORMAL HIGH (ref 0.61–1.24)
GFR calc Af Amer: 41 mL/min — ABNORMAL LOW (ref >60)
GFR calc non Af Amer: 35 mL/min — ABNORMAL LOW (ref >60)
GLUCOSE: 102 mg/dL — AB (ref 70–99)
Potassium: 3.9 mmol/L (ref 3.5–5.1)
Sodium: 140 mmol/L (ref 135–145)

## 2018-02-16 LAB — TROPONIN I
Troponin I: 0.1 ng/mL (ref <0.03)
Troponin I: 0.09 ng/mL (ref <0.03)

## 2018-02-16 MED ORDER — LISINOPRIL 10 MG PO TABS: 10.0000 mg | ORAL_TABLET | Freq: Every day | ORAL | 0 refills | Status: AC

## 2018-02-16 MED ORDER — LISINOPRIL 10 MG PO TABS
10.0000 mg | ORAL_TABLET | Freq: Once | ORAL | Status: AC
  Administered 2018-02-16: 10 mg via ORAL
  Filled 2018-02-16: qty 1

## 2018-02-16 NOTE — ED Provider Notes (Signed)
[facility] EMERGENCY DEPARTMENT Provider Note   CSN: [id number] Arrival date & time: 02/15/18  2117     History   Chief Complaint Chief Complaint  Patient presents with  . Hypertension    HPI Jason Ochoa is a 57 y.o. male.  The history is provided by the patient.  He has history of hypertension, combined systolic and diastolic heart failure, chronic kidney disease and comes in because his blood pressure was very elevated at home.  He had recently been admitted to the hospital for severe hypertension and started on amlodipine, metoprolol, furosemide.  He has been compliant with these medications.  Tonight, routine blood pressure check was very high with readings approximately 210/140.  He denies headache, tinnitus, epistaxis.  He denies chest pain, heaviness, tightness, pressure.  There has been no change in his dyspnea.  Past Medical History:  Diagnosis Date  . Hypertension    not currently taking meds  . Stage 3 chronic kidney disease [facility](HCC)     Patient Active Problem List   Diagnosis Date Noted  . Hypertensive emergency 02/13/2018  . Elevated troponin 02/13/2018  . Stage 3 chronic kidney disease (HCC) 02/13/2018  . Acute combined systolic and diastolic CHF, NYHA class 2 (HCC) 02/13/2018    History reviewed. No pertinent surgical history.      Home Medications    Prior to Admission medications   Medication Sig Start Date End Date Taking? Authorizing Provider  amLODipine (NORVASC) 5 MG tablet Take 1 tablet (5 mg total) by mouth daily. 02/14/18   Erick BlinksMemon, Jehanzeb, MD  furosemide (LASIX) 40 MG tablet Take 1 tablet (40 mg total) by mouth daily. 02/13/18 02/13/19  Erick BlinksMemon, Jehanzeb, MD  metoprolol tartrate (LOPRESSOR) 25 MG tablet Take 1 tablet (25 mg total) by mouth 2 (two) times daily. 02/13/18   Erick BlinksMemon, Jehanzeb, MD    Family History Family History  Problem Relation Age of Onset  . Kidney failure Mother   . Heart disease Mother   . Hypertension Mother   . Hypertension  Father   . Hypertension Brother   . Hypertension Brother     Social History Social History   Tobacco Use  . Smoking status: Former Games developermoker  . Smokeless tobacco: Never Used  Substance Use Topics  . Alcohol use: Yes  . Drug use: Yes    Types: Marijuana    Comment: every night     Allergies   Patient has no known allergies.   Review of Systems Review of Systems  All other systems reviewed and are negative.    Physical Exam Updated Vital Signs BP (!) 171/115   Pulse (!) 59   Temp 98.1 F (36.7 C)   Resp 19   Ht 5\' 10"  (1.778 m)   Wt 96.6 kg (213 lb)   SpO2 94%   BMI 30.56 kg/m   Physical Exam  Nursing note and vitals reviewed.  57 year old male, resting comfortably and in no acute distress. Vital signs are significant for elevated blood pressure. Oxygen saturation is 94%, which is normal. Head is normocephalic and atraumatic. PERRLA, EOMI. Oropharynx is clear. Neck is nontender and supple without adenopathy or JVD. Back is nontender and there is no CVA tenderness. Lungs are clear without rales, wheezes, or rhonchi. Chest is nontender. Heart has regular rate and rhythm without murmur. Abdomen is soft, flat, nontender without masses or hepatosplenomegaly and peristalsis is normoactive. Extremities have trace edema, full range of motion is present. Skin is warm and dry without rash.  Neurologic: Mental status is normal, cranial nerves are intact, there are no motor or sensory deficits.  ED Treatments / Results  Labs (all labs ordered are listed, but only abnormal results are displayed) Labs Reviewed  CBC WITH DIFFERENTIAL/PLATELET - Abnormal; Notable for the following components:      Result Value   Hemoglobin 12.4 (*)    HCT 38.6 (*)    All other components within normal limits  BASIC METABOLIC PANEL - Abnormal; Notable for the following components:   Glucose, Bld 102 (*)    BUN 27 (*)    Creatinine, Ser 2.02 (*)    GFR calc non Af Amer 35 (*)    GFR  calc Af Amer 41 (*)    All other components within normal limits  TROPONIN I - Abnormal; Notable for the following components:   Troponin I 0.10 (*)    All other components within normal limits  TROPONIN I - Abnormal; Notable for the following components:   Troponin I 0.09 (*)    All other components within normal limits    Procedures Procedures   Medications Ordered in ED Medications  lisinopril (PRINIVIL,ZESTRIL) tablet 10 mg (10 mg Oral Given 02/16/18 0222)     Initial Impression / Assessment and Plan / ED Course  I have reviewed the triage vital signs and the nursing notes.  Pertinent lab results that were available during my care of the patient were reviewed by me and considered in my medical decision making (see chart for details).  Hypertension which is poorly controlled.  BP readings are high in the ED, but are coming down with simple observation.  Old records are reviewed confirming recent hospitalization for hypertensive urgency.  We will recheck screening labs and observe in the emergency department.  At this point, no indication for emergent blood pressure reduction.  5:09 AM Repeat troponin is actually continuing to trend down, no evidence of ACS.  Blood pressure remains elevated, but not severely so.  He is discharged with addition of lisinopril to his blood pressure regimen.  He is to keep a written record of his blood pressure readings.  He has not yet made an appointment with his PCP, but is urged to call today for an appointment as soon as possible.  Return precautions discussed.  Final Clinical Impressions(s) / ED Diagnoses   Final diagnoses:  Uncontrolled hypertension  Elevated troponin I level  Renal insufficiency    ED Discharge Orders        Ordered    lisinopril (PRINIVIL,ZESTRIL) 10 MG tablet  Daily     02/16/18 0456       Dione Booze, MD 02/16/18 [phone]

## 2018-02-16 NOTE — Discharge Instructions (Signed)
Continue to monitor your blood pressure at home. Keep a record of the readings, and bring that with you when you see your doctor.

## 2018-02-16 NOTE — ED Notes (Signed)
Date and time results received: 02/16/18 0130 (use smartphrase ".now" to insert current time)  Test: troponin Critical Value: 0.1  Name of Provider Notified: Dr Preston FleetingGlick  Orders Received? Or Actions Taken?: Actions Taken: no orders received

## 2018-04-05 ENCOUNTER — Other Ambulatory Visit (HOSPITAL_COMMUNITY): Payer: Self-pay | Admitting: Nephrology

## 2018-04-05 DIAGNOSIS — N183 Chronic kidney disease, stage 3 unspecified: Secondary | ICD-10-CM

## 2018-04-15 ENCOUNTER — Ambulatory Visit (HOSPITAL_COMMUNITY)
Admission: RE | Admit: 2018-04-15 | Discharge: 2018-04-15 | Disposition: A | Discharge status: Home / Self Care | Payer: Self-pay | Source: Ambulatory Visit | Attending: Nephrology | Admitting: Nephrology

## 2018-04-15 DIAGNOSIS — N183 Chronic kidney disease, stage 3 unspecified: Secondary | ICD-10-CM

## 2019-03-21 IMAGING — DX DG CHEST 2V
2 series · 2 of 2 positions shown · non-contrast
Comparison: None.

CLINICAL DATA: [DATE] week history of congestion and shortness of
breath.

EXAM:
CHEST - 2 VIEW

[chest pa]
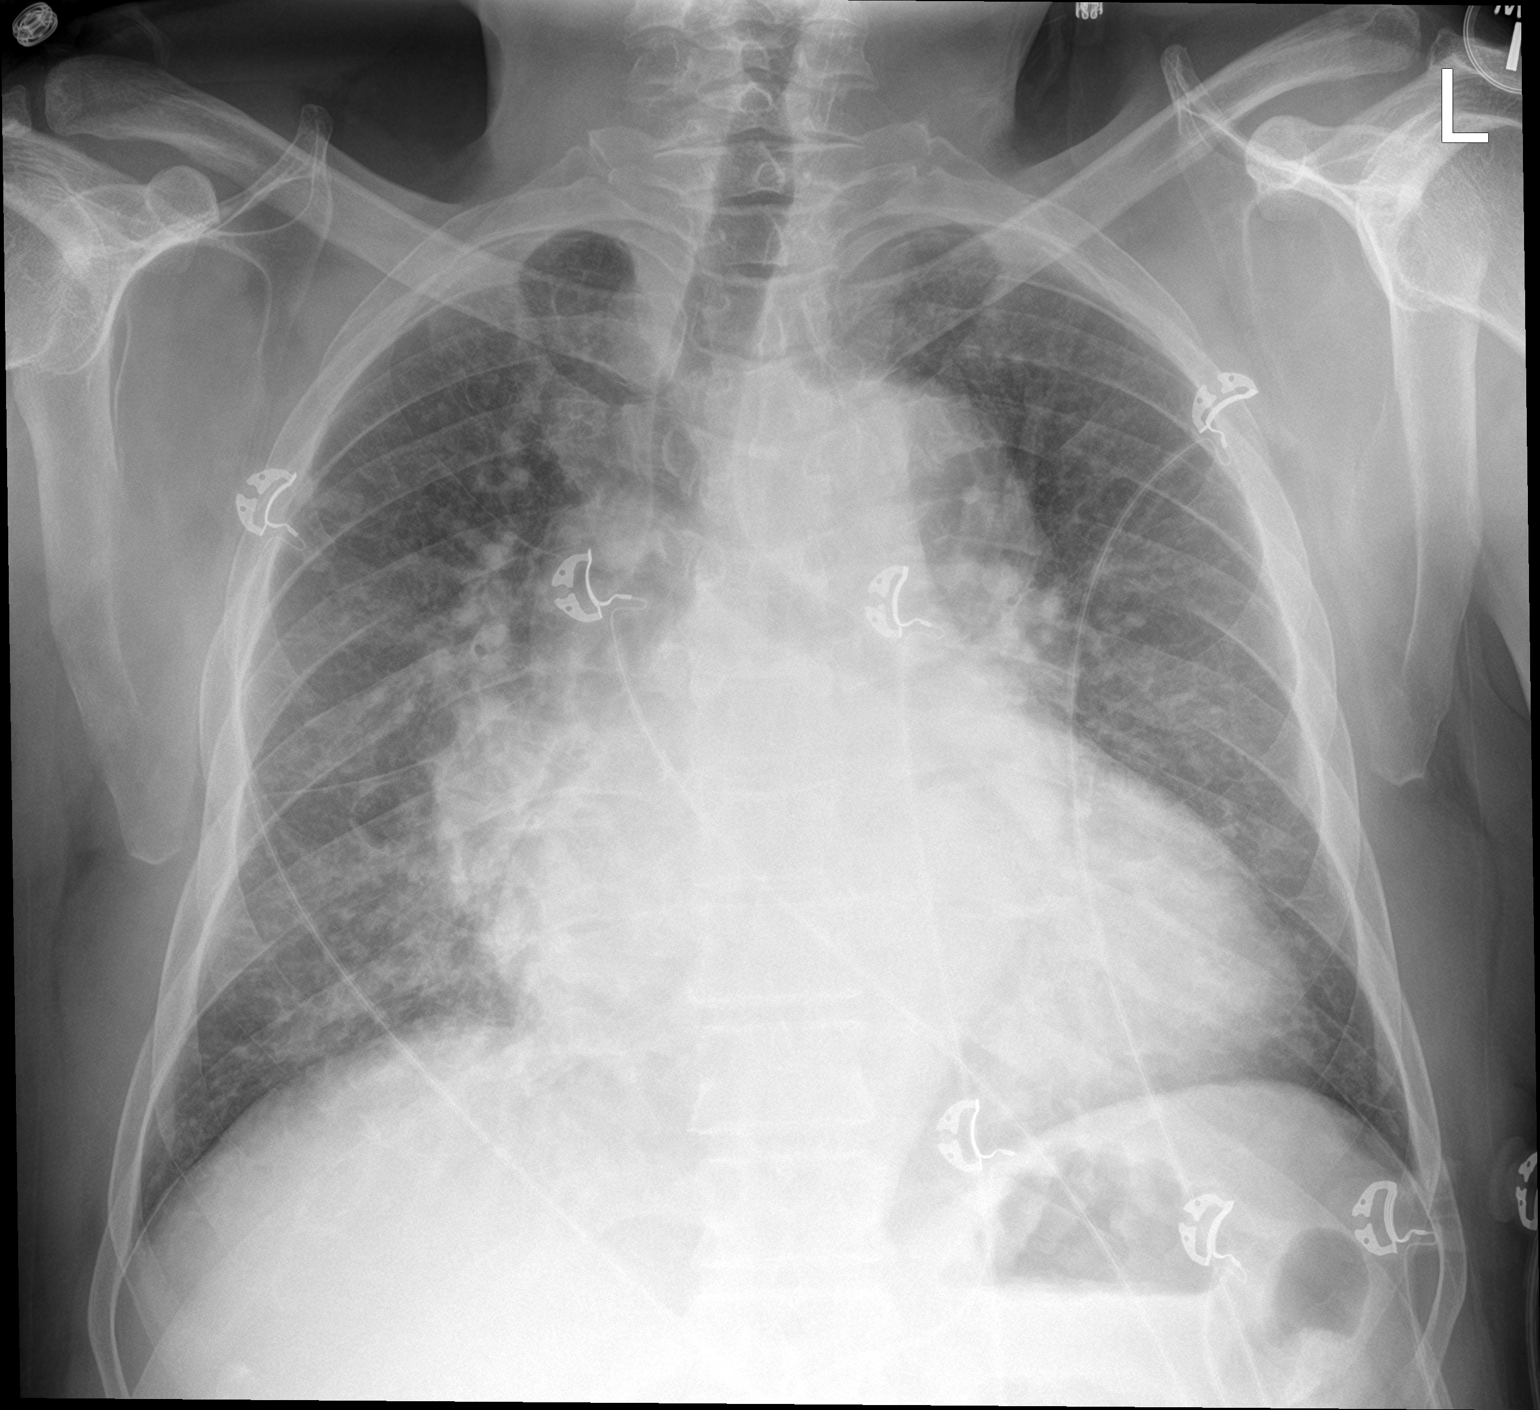

[chest lat]
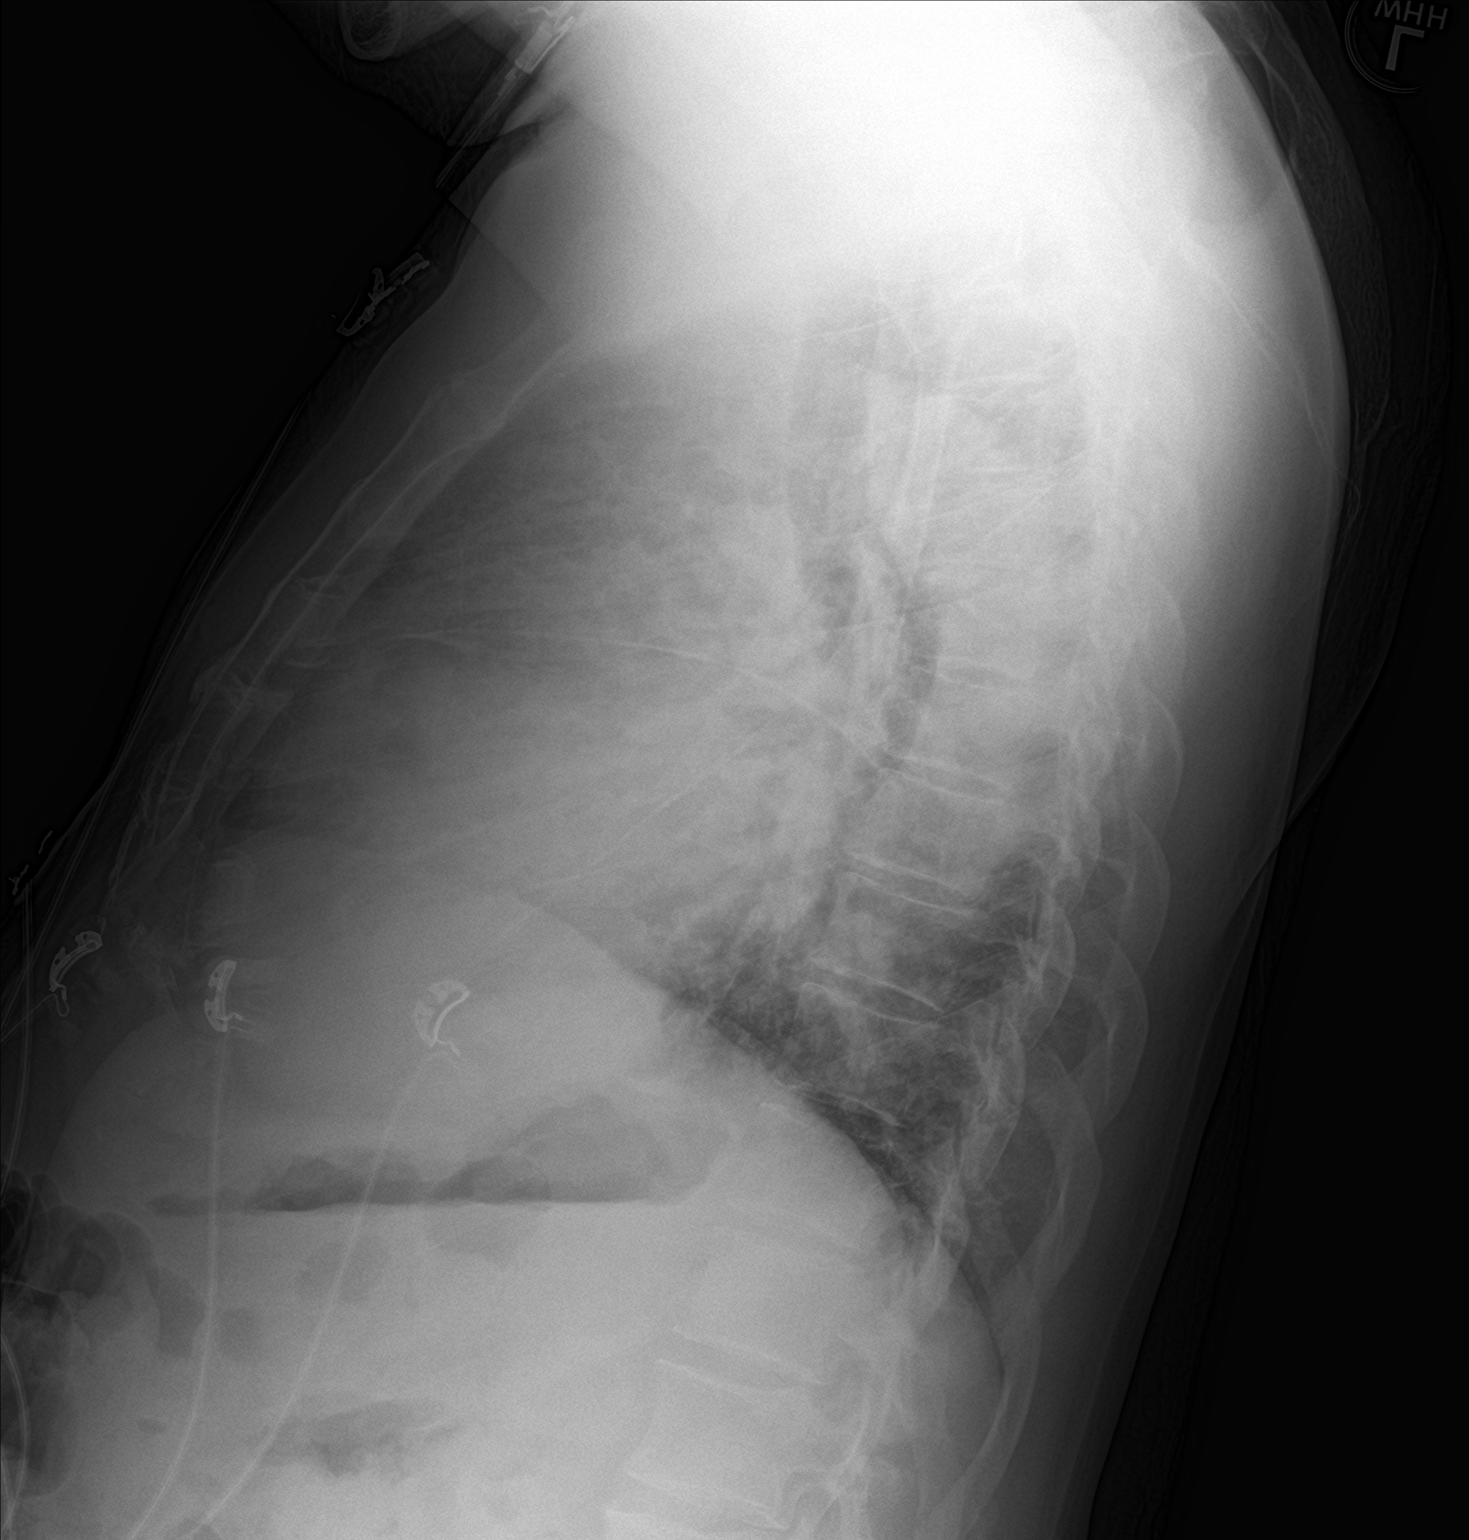

[2 of 2 positions shown; findings below may reference images not displayed]

FINDINGS: Cardiopericardial silhouette is markedly enlarged. There is
pulmonary vascular congestion with interstitial pulmonary edema
pattern. No pleural effusion. The visualized bony structures of the
thorax are intact. Telemetry leads overlie the chest.
IMPRESSION: Marked enlargement of the cardiopericardial silhouette. Likely
related to cardiomegaly although pericardial effusion not excluded.

Vascular congestion with interstitial edema.

## 2019-04-25 IMAGING — US US RENAL
1 series · 14 of 25 positions shown · non-contrast
Comparison: None.

CLINICAL DATA: Chronic kidney disease stage 3

EXAM:
RENAL / URINARY TRACT ULTRASOUND COMPLETE

[Series 1: us renal · 0.25mm/px · 14 of 34 slices shown]
[im 1/34]
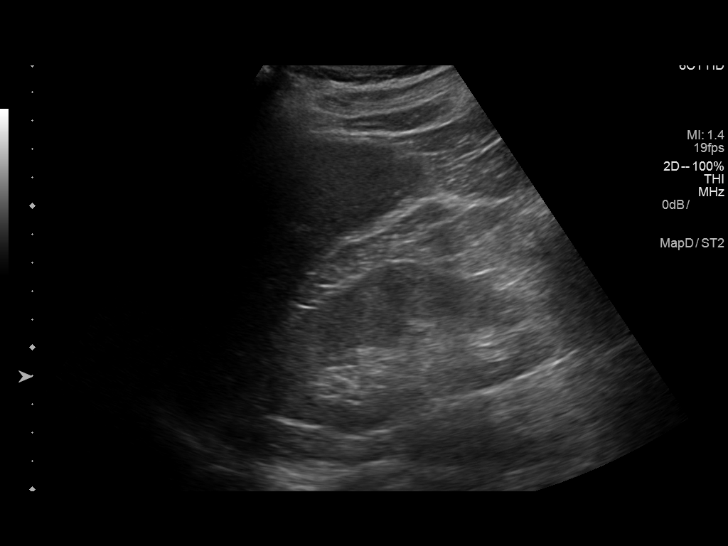
[im 3/34]
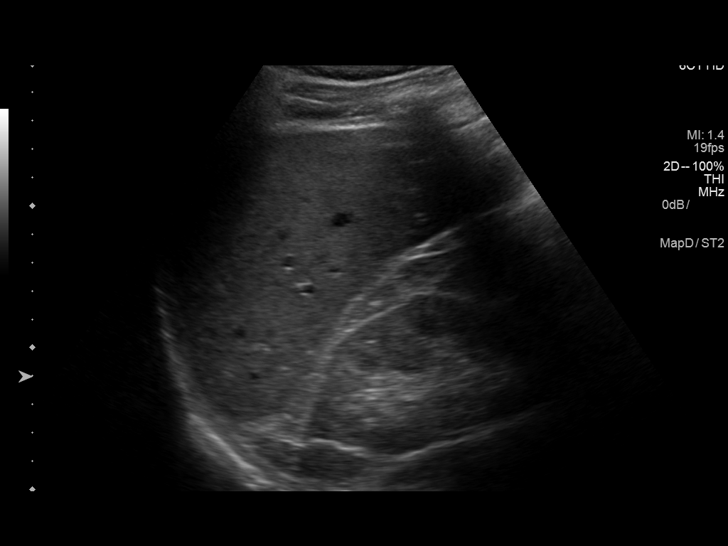
[im 6/34]
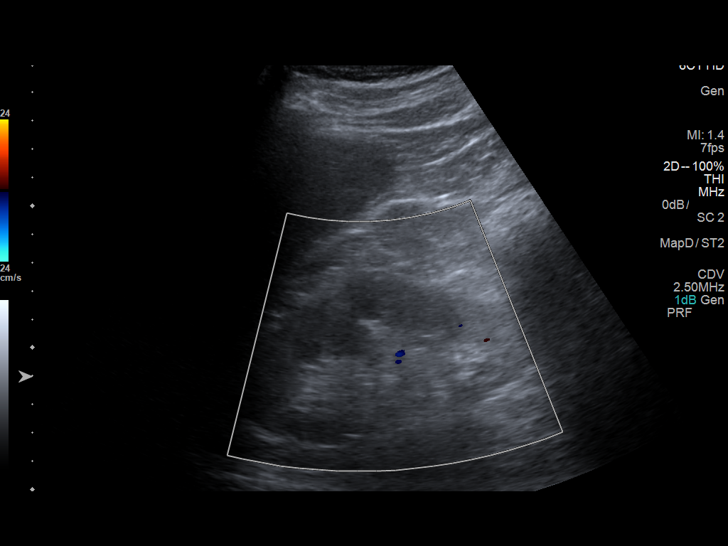
[im 9/34]
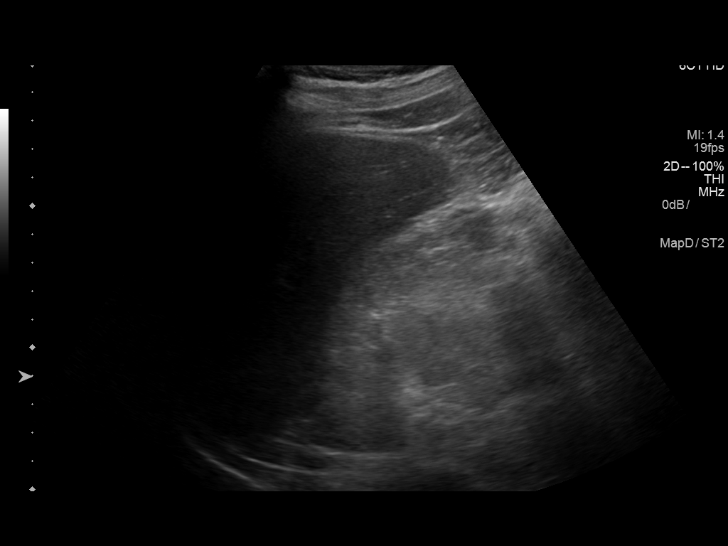
[im 12/34]
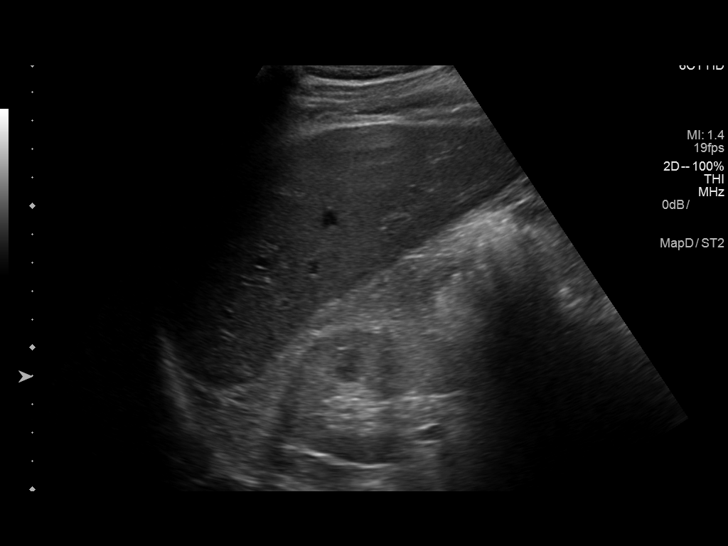
[im 13/34]
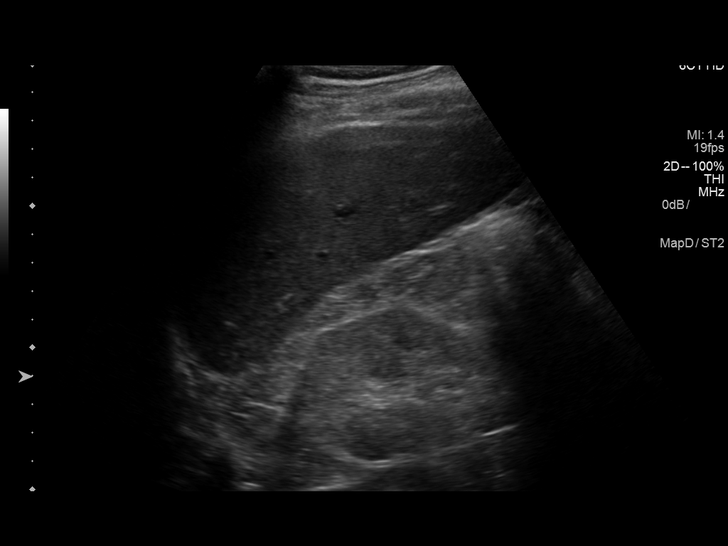
[im 16/34]
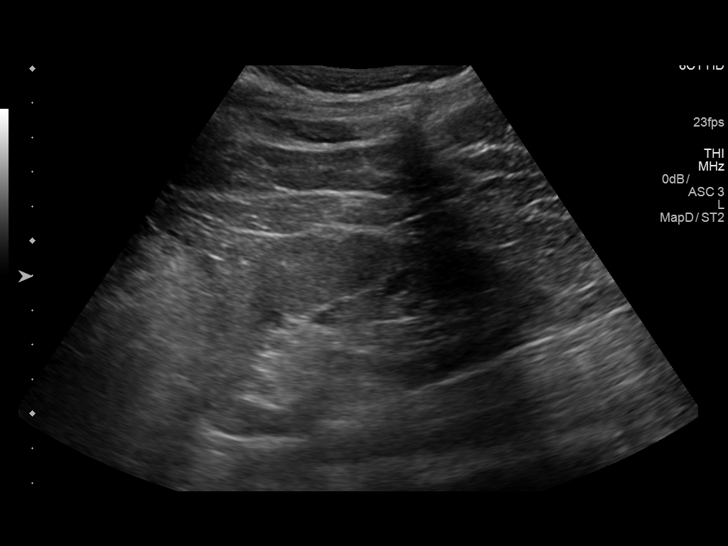
[im 18/34]
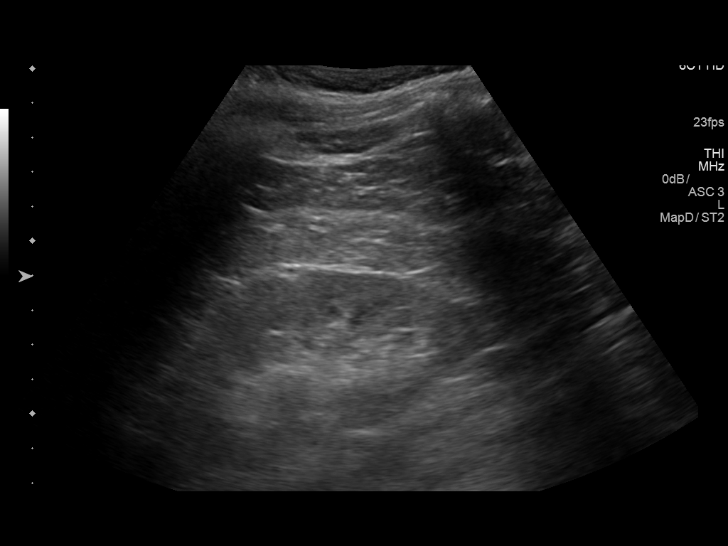
[im 21/34]
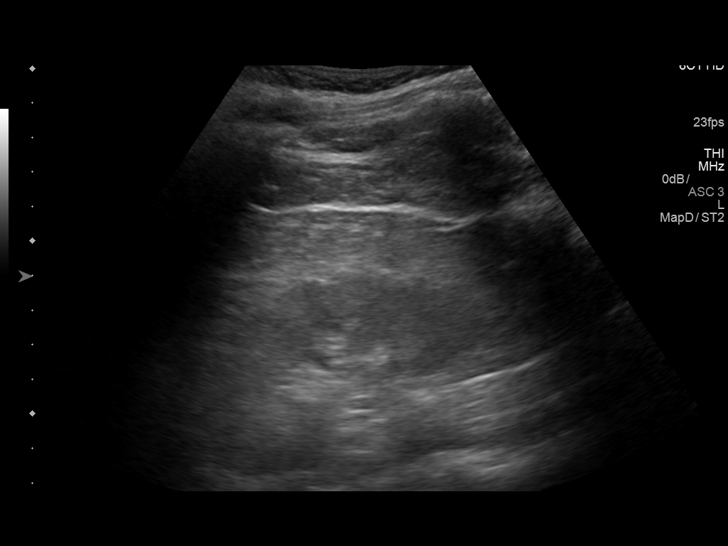
[im 23/34]
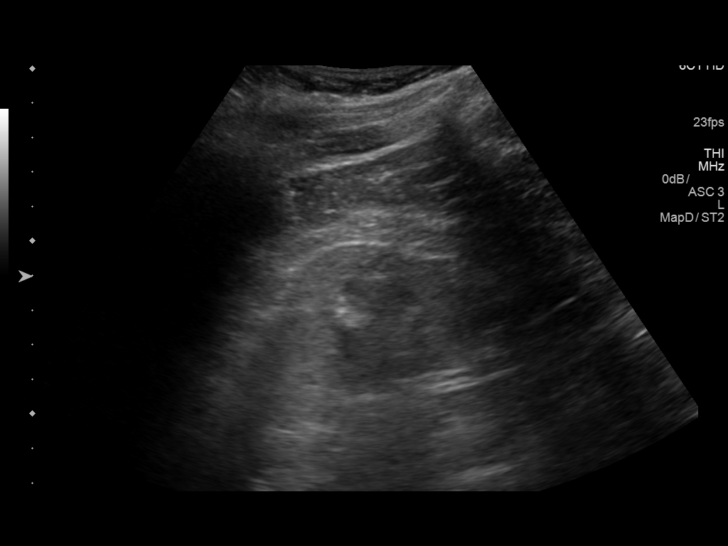
[im 25/34]
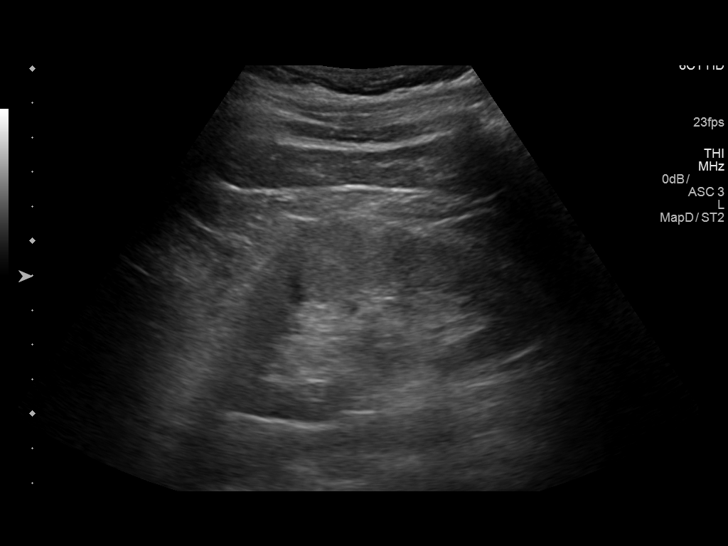
[im 28/34]
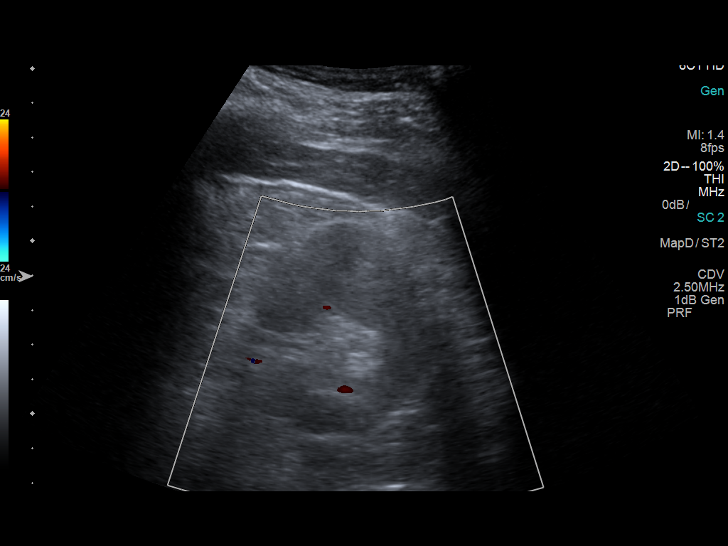
[im 31/34]
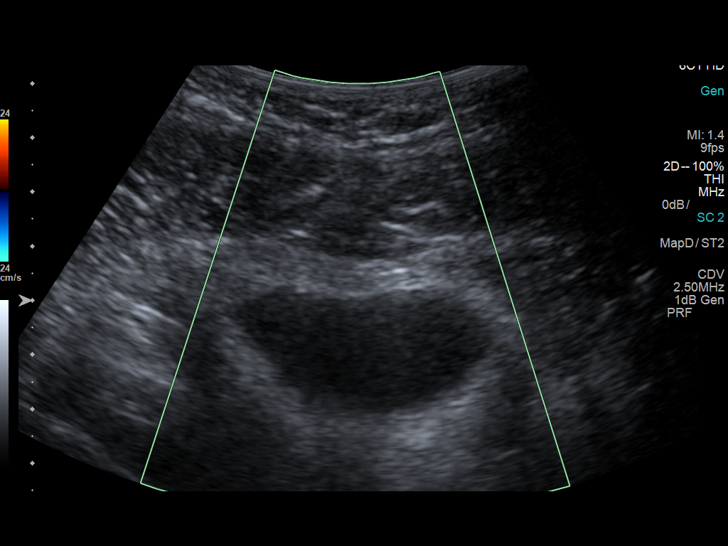
[im 34/34]
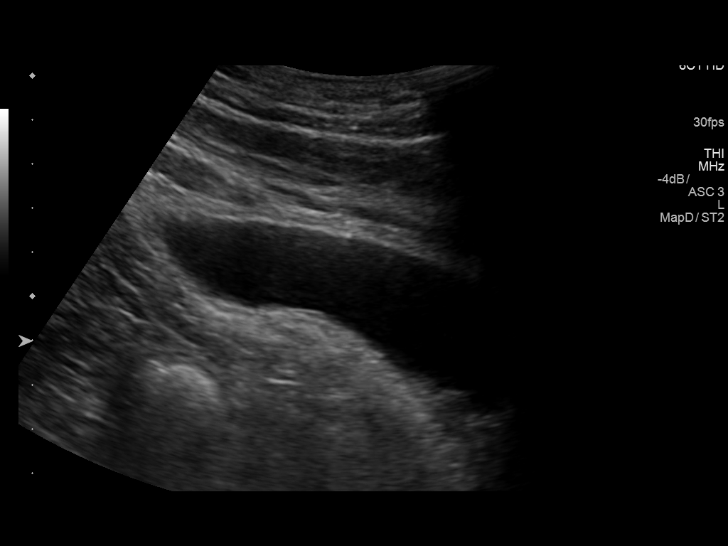

[14 of 25 positions shown; findings below may reference images not displayed]

FINDINGS: Right Kidney:

Length: 10.5 cm. Mildly increased echotexture. No mass or
hydronephrosis.

Left Kidney:

Length: 10.4 cm. Mildly increased echotexture. No mass or
hydronephrosis.

Bladder:

Appears normal for degree of bladder distention.
IMPRESSION: Increased echotexture in the kidneys bilaterally compatible with
chronic medical renal disease. No acute findings. No hydronephrosis.

## 2020-04-02 ENCOUNTER — Other Ambulatory Visit: Payer: Self-pay | Admitting: Critical Care Medicine

## 2020-04-02 ENCOUNTER — Other Ambulatory Visit: Payer: Self-pay

## 2020-04-02 DIAGNOSIS — Z20822 Contact with and (suspected) exposure to covid-19: Secondary | ICD-10-CM

## 2020-04-05 LAB — NOVEL CORONAVIRUS, NAA

## 2020-04-05 LAB — SPECIMEN STATUS REPORT

## 2020-04-05 LAB — SARS-COV-2, NAA 2 DAY TAT

## 2020-04-06 ENCOUNTER — Other Ambulatory Visit: Payer: Self-pay

## 2020-04-06 DIAGNOSIS — Z20822 Contact with and (suspected) exposure to covid-19: Secondary | ICD-10-CM

## 2020-04-07 LAB — SARS-COV-2, NAA 2 DAY TAT

## 2020-04-07 LAB — NOVEL CORONAVIRUS, NAA: SARS-CoV-2, NAA: NOT DETECTED

## 2023-01-18 ENCOUNTER — Emergency Department (HOSPITAL_COMMUNITY): Payer: BC Managed Care – PPO

## 2023-01-18 ENCOUNTER — Other Ambulatory Visit: Payer: Self-pay

## 2023-01-18 ENCOUNTER — Encounter (HOSPITAL_COMMUNITY): Payer: Self-pay | Admitting: *Deleted

## 2023-01-18 ENCOUNTER — Emergency Department (HOSPITAL_COMMUNITY)
Admission: EM | Admit: 2023-01-18 | Discharge: 2023-01-18 | Disposition: A | Discharge status: Home / Self Care | Payer: BC Managed Care – PPO | Attending: Emergency Medicine | Admitting: Emergency Medicine

## 2023-01-18 DIAGNOSIS — Z23 Encounter for immunization: Secondary | ICD-10-CM | POA: Insufficient documentation

## 2023-01-18 DIAGNOSIS — Y92410 Unspecified street and highway as the place of occurrence of the external cause: Secondary | ICD-10-CM | POA: Insufficient documentation

## 2023-01-18 DIAGNOSIS — S41011A Laceration without foreign body of right shoulder, initial encounter: Secondary | ICD-10-CM | POA: Insufficient documentation

## 2023-01-18 DIAGNOSIS — S51811A Laceration without foreign body of right forearm, initial encounter: Secondary | ICD-10-CM | POA: Diagnosis not present

## 2023-01-18 DIAGNOSIS — I10 Essential (primary) hypertension: Secondary | ICD-10-CM | POA: Insufficient documentation

## 2023-01-18 DIAGNOSIS — Z79899 Other long term (current) drug therapy: Secondary | ICD-10-CM | POA: Diagnosis not present

## 2023-01-18 DIAGNOSIS — R519 Headache, unspecified: Secondary | ICD-10-CM | POA: Insufficient documentation

## 2023-01-18 MED ORDER — LIDOCAINE HCL (PF) 1 % IJ SOLN
30.0000 mL | Freq: Once | INTRAMUSCULAR | Status: DC
  Filled 2023-01-18: qty 30

## 2023-01-18 MED ORDER — CEPHALEXIN 500 MG PO CAPS: 500.0000 mg | ORAL_CAPSULE | Freq: Four times a day (QID) | ORAL | 0 refills | Status: AC

## 2023-01-18 MED ORDER — BACITRACIN ZINC 500 UNIT/GM EX OINT
TOPICAL_OINTMENT | Freq: Once | CUTANEOUS | Status: AC
  Administered 2023-01-18: 1 via TOPICAL
  Filled 2023-01-18: qty 0.9

## 2023-01-18 MED ORDER — TETANUS-DIPHTH-ACELL PERTUSSIS 5-2.5-18.5 LF-MCG/0.5 IM SUSY
0.5000 mL | PREFILLED_SYRINGE | Freq: Once | INTRAMUSCULAR | Status: AC
  Administered 2023-01-18: 0.5 mL via INTRAMUSCULAR
  Filled 2023-01-18: qty 0.5

## 2023-01-18 MED ORDER — HYDROCODONE-ACETAMINOPHEN 5-325 MG PO TABS: 1.0000 | ORAL_TABLET | Freq: Four times a day (QID) | ORAL | 0 refills | Status: AC | PRN

## 2023-01-18 MED ORDER — HYDROCODONE-ACETAMINOPHEN 5-325 MG PO TABS
1.0000 | ORAL_TABLET | Freq: Once | ORAL | Status: AC
  Administered 2023-01-18: 1 via ORAL
  Filled 2023-01-18: qty 1

## 2023-01-18 MED ORDER — CEPHALEXIN 500 MG PO CAPS
500.0000 mg | ORAL_CAPSULE | Freq: Once | ORAL | Status: AC
  Administered 2023-01-18: 500 mg via ORAL
  Filled 2023-01-18: qty 1
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
## 2023-01-18 NOTE — ED Notes (Signed)
See triage notes.large gapping lac noted to right anterior shoulder with bleeding controlled, large area of skin hanging from right posterior fa with bleeding controlled. Wet gauze and little tape applied to both areas at this time.
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
## 2023-01-18 NOTE — ED Notes (Signed)
Pt returned from xray

## 2023-01-18 NOTE — ED Notes (Signed)
Pt in xray

## 2023-07-16 ENCOUNTER — Other Ambulatory Visit (HOSPITAL_COMMUNITY): Payer: Self-pay | Admitting: Nephrology

## 2023-07-16 DIAGNOSIS — N1832 Chronic kidney disease, stage 3b: Secondary | ICD-10-CM

## 2023-07-16 DIAGNOSIS — R809 Proteinuria, unspecified: Secondary | ICD-10-CM

## 2023-07-24 ENCOUNTER — Ambulatory Visit (HOSPITAL_COMMUNITY)
Admission: RE | Admit: 2023-07-24 | Discharge: 2023-07-24 | Disposition: A | Discharge status: Home / Self Care | Payer: BC Managed Care – PPO | Source: Ambulatory Visit | Attending: Nephrology | Admitting: Nephrology

## 2023-07-24 DIAGNOSIS — N1832 Chronic kidney disease, stage 3b: Secondary | ICD-10-CM | POA: Insufficient documentation

## 2023-07-24 DIAGNOSIS — R809 Proteinuria, unspecified: Secondary | ICD-10-CM | POA: Insufficient documentation
